# Patient Record
Sex: Male | Born: 1940 | Race: White | Hispanic: No | Marital: Married
Health system: Midwestern US, Academic
[De-identification: ages and names within clinical notes are randomized; demographics above are authoritative.]

---

## 2016-05-06 MED ORDER — METOPROLOL SUCCINATE 100 MG PO TB24
100 mg | ORAL_TABLET | Freq: Two times a day (BID) | ORAL | 3 refills | 90.00000 days | Status: DC
Start: 2016-05-06 — End: 2016-05-06

## 2016-05-06 MED ORDER — PERFLUTREN LIPID MICROSPHERES 1.1 MG/ML IV SUSP
1-20 mL | Freq: Once | INTRAVENOUS | 0 refills | Status: CP
Start: 2016-05-06 — End: ?

## 2016-05-06 MED ORDER — METOPROLOL SUCCINATE 100 MG PO TB24
100 mg | ORAL_TABLET | Freq: Two times a day (BID) | ORAL | 3 refills | 90.00000 days | Status: DC
Start: 2016-05-06 — End: 2016-05-14

## 2016-05-29 MED ORDER — PROPOFOL INJ 10 MG/ML IV VIAL
0 refills | Status: DC
Start: 2016-05-29 — End: 2016-05-29

## 2016-05-29 MED ORDER — ROCURONIUM 10 MG/ML IV SOLN
INTRAVENOUS | 0 refills | Status: DC
Start: 2016-05-29 — End: 2016-05-29

## 2016-05-29 MED ORDER — EPHEDRINE SULFATE 50 MG/5ML SYR (10 MG/ML) (AN)(OSM)
0 refills | Status: DC
Start: 2016-05-29 — End: 2016-05-29

## 2016-05-29 MED ORDER — FUROSEMIDE 10 MG/ML IJ SOLN
0 refills | Status: DC
Start: 2016-05-29 — End: 2016-05-29

## 2016-05-29 MED ORDER — SUGAMMADEX 100 MG/ML IV SOLN
INTRAVENOUS | 0 refills | Status: DC
Start: 2016-05-29 — End: 2016-05-29

## 2016-05-29 MED ORDER — FENTANYL CITRATE (PF) 50 MCG/ML IJ SOLN
0 refills | Status: DC
Start: 2016-05-29 — End: 2016-05-29

## 2016-05-29 MED ORDER — LIDOCAINE (PF) 200 MG/10 ML (2 %) IJ SYRG
0 refills | Status: DC
Start: 2016-05-29 — End: 2016-05-29

## 2016-05-29 MED ORDER — DEXAMETHASONE SODIUM PHOSPHATE 4 MG/ML IJ SOLN
INTRAVENOUS | 0 refills | Status: DC
Start: 2016-05-29 — End: 2016-05-29

## 2016-05-29 MED ORDER — PHENYLEPHRINE IN 0.9% NACL(PF) 1 MG/10 ML (100 MCG/ML) IV SYRG
INTRAVENOUS | 0 refills | Status: DC
Start: 2016-05-29 — End: 2016-05-29

## 2016-05-29 MED ORDER — ONDANSETRON HCL (PF) 4 MG/2 ML IJ SOLN
INTRAVENOUS | 0 refills | Status: DC
Start: 2016-05-29 — End: 2016-05-29

## 2016-06-27 MED ORDER — MIRABEGRON 50 MG PO TB24
50 mg | ORAL_TABLET | Freq: Every day | ORAL | 11 refills | Status: DC
Start: 2016-06-27 — End: 2016-08-22

## 2016-06-27 MED ORDER — TROSPIUM 60 MG PO CP24
60 mg | ORAL_CAPSULE | Freq: Every day | ORAL | 11 refills | 30.00000 days | Status: DC
Start: 2016-06-27 — End: 2017-02-21

## 2016-08-21 ENCOUNTER — Encounter: Admit: 2016-08-21 | Discharge: 2016-08-21 | Payer: MEDICARE

## 2016-08-21 MED ORDER — MYRBETRIQ 50 MG PO TB24
50 mg | ORAL_TABLET | Freq: Every day | ORAL | 3 refills | Status: AC
Start: 2016-08-21 — End: ?

## 2016-08-22 ENCOUNTER — Ambulatory Visit: Admit: 2016-08-22 | Discharge: 2016-08-22 | Payer: MEDICARE

## 2016-08-22 ENCOUNTER — Encounter: Admit: 2016-08-22 | Discharge: 2016-08-22 | Payer: MEDICARE

## 2016-08-22 DIAGNOSIS — I480 Paroxysmal atrial fibrillation: ICD-10-CM

## 2016-08-22 DIAGNOSIS — N390 Urinary tract infection, site not specified: ICD-10-CM

## 2016-08-22 DIAGNOSIS — K219 Gastro-esophageal reflux disease without esophagitis: ICD-10-CM

## 2016-08-22 DIAGNOSIS — N401 Enlarged prostate with lower urinary tract symptoms: Principal | ICD-10-CM

## 2016-08-22 DIAGNOSIS — K635 Polyp of colon: ICD-10-CM

## 2016-08-22 DIAGNOSIS — I10 Essential (primary) hypertension: ICD-10-CM

## 2016-08-22 DIAGNOSIS — Z9889 Other specified postprocedural states: ICD-10-CM

## 2016-08-22 DIAGNOSIS — N529 Male erectile dysfunction, unspecified: ICD-10-CM

## 2016-08-22 DIAGNOSIS — N400 Enlarged prostate without lower urinary tract symptoms: ICD-10-CM

## 2016-08-22 DIAGNOSIS — I219 Acute myocardial infarction, unspecified: ICD-10-CM

## 2016-08-22 DIAGNOSIS — R32 Unspecified urinary incontinence: ICD-10-CM

## 2016-08-22 DIAGNOSIS — T50905A Adverse effect of unspecified drugs, medicaments and biological substances, initial encounter: ICD-10-CM

## 2016-08-22 DIAGNOSIS — G4730 Sleep apnea, unspecified: ICD-10-CM

## 2016-08-22 DIAGNOSIS — E785 Hyperlipidemia, unspecified: Principal | ICD-10-CM

## 2016-08-22 DIAGNOSIS — E039 Hypothyroidism, unspecified: ICD-10-CM

## 2016-08-22 DIAGNOSIS — I428 Other cardiomyopathies: ICD-10-CM

## 2016-08-22 DIAGNOSIS — Z95810 Presence of automatic (implantable) cardiac defibrillator: ICD-10-CM

## 2016-08-22 DIAGNOSIS — Z8679 Personal history of other diseases of the circulatory system: ICD-10-CM

## 2016-08-22 DIAGNOSIS — K469 Unspecified abdominal hernia without obstruction or gangrene: ICD-10-CM

## 2016-08-22 DIAGNOSIS — E162 Hypoglycemia, unspecified: ICD-10-CM

## 2016-08-22 DIAGNOSIS — M1990 Unspecified osteoarthritis, unspecified site: ICD-10-CM

## 2016-08-22 DIAGNOSIS — Z789 Other specified health status: ICD-10-CM

## 2016-08-22 DIAGNOSIS — R040 Epistaxis: ICD-10-CM

## 2016-08-22 DIAGNOSIS — I340 Nonrheumatic mitral (valve) insufficiency: ICD-10-CM

## 2016-08-22 DIAGNOSIS — Z9229 Personal history of other drug therapy: ICD-10-CM

## 2016-08-22 NOTE — Progress Notes
Date of Service: 08/22/2016     Chief Complaint   Patient presents with   ??? Benign Prostatic Hypertrophy       Subjective:             Joseph Hoover is a 76 y.o. male.    History of Present Illness    76 yo male with 23 g prostate s/p HoLAP 05/29/2016.  Path negative, 2 g.  Having recurrent LUTS with very slow stream, Urgency.      Preop AUA ss 24, bother 4  Postop AUA ss 23, bother 4    Preop SHIM 0  Postop SHIM 1    Preop PVR 94   Postop PVR 200     Review of Systems      Constitutional: Negative for fever, chills, fatigue and unexpected weight change.   HENT: Negative for hearing loss, sore throat and voice change.    Eyes: Negative for visual disturbance.   Respiratory: Negative for cough, shortness of breath and wheezing.    Cardiovascular: Negative for chest pain, palpitations and leg swelling.   Gastrointestinal: Negative for nausea, vomiting, abdominal pain, diarrhea and constipation.   Endocrine: Negative for polydipsia and polyuria.   Musculoskeletal: Negative for back pain, joint swelling and arthralgias.   Skin: Negative for rash.   Neurological: Negative for dizziness, seizures, weakness, light-headedness and headaches.   Hematological: Negative for adenopathy. Does not bruise/bleed easily.   Psychiatric/Behavioral: Negative for suicidal ideas and confusion.       Objective:         ??? acetaminophen (TYLENOL) 500 mg tablet Take 1,000 mg by mouth at bedtime as needed.   ??? acetaminophen/codeine (TYLENOL #3) 300/30 mg tablet Take 1-2 Tabs by mouth every 4 hours as needed for Pain.   ??? albuterol (PROVENTIL HFA, VENTOLIN HFA) 90 mcg/Actuation IN inhaler Inhale 1 puff by mouth into the lungs every 6 hours as needed for Wheezing.   ??? artificial tears(hypromellose) 0.4 % ophthalmic solution Place 1 drop into or around eye(s) five times daily as needed.   ??? aspirin EC 81 mg tablet Take 81 mg by mouth daily.   ??? cetirizine (ZYRTEC) 10 mg tablet Take 10 mg by mouth at bedtime daily. ??? cholecalciferol (VITAMIN D-3) 1,000 units tablet Take 1,000 Units by mouth daily.   ??? coQ10 (ubiquinol) 100 mg cap Take 1 capsule by mouth twice daily.   ??? cyanocobalamin (VITAMIN B-12, RUBRAMIN) 1,000 mcg/mL injection Inject 1 mL into the muscle every 30 days.   ??? digoxin (LANOXIN) 250 mcg tablet Take 1 Tab by mouth daily.   ??? finasteride (PROSCAR) 5 mg tablet Take 5 mg by mouth at bedtime daily.   ??? fluticasone (FLONASE) 50 mcg/Actuation nasal spray Apply 1 spray to each nostril as directed at bedtime daily.   ??? furosemide (LASIX) 20 mg tablet Take 60 mg by mouth daily.   ??? guaiFENesin LA (MUCINEX) 600 mg tablet Take 600 mg by mouth at bedtime daily.   ??? HYDROcodone/acetaminophen (NORCO) 5/325 mg tablet Take 1 tablet by mouth every 6 hours as needed for Pain   ??? levothyroxine (SYNTHROID) 75 mcg tablet Take 1 Tab by mouth daily.   ??? Lidocaine 4 % ptmd Apply 1 patch topically to affected area daily as needed.   ??? losartan(+) (COZAAR) 100 mg tablet Take 50 mg by mouth daily.   ??? metoprolol XL (TOPROL XL) 100 mg extended release tablet Take 100 mg by mouth twice daily.   ??? MYRBETRIQ 50 mg  tablet Take 1 tablet by mouth daily.   ??? nitroglycerin (NITROSTAT) 0.4 mg tablet 1 Tab As Needed for Chest Pain.   ??? potassium chloride SR (K-DUR) 20 mEq tablet Take 20 mEq by mouth daily. Take with a meal and a full glass of water.   ??? ranitidine(+) (ZANTAC) 150 mg tablet Take 150 mg by mouth at bedtime daily.   ??? Saliva Stimulant Agents Comb.3 (BIOTENE MOISTURIZING MOUTH) spry by Mucous Membrane route daily as needed.   ??? senna/docusate (SENOKOT-S) 8.6/50 mg tablet Take 1 tablet by mouth twice daily as needed.   ??? traMADol (ULTRAM) 50 mg tablet Take 50 mg by mouth every 6 hours as needed for Pain.   ??? trospium ER(+) (SANCTURA XR) 60 mg capsule Take 1 capsule by mouth daily. Do not cut/ crush/ chew   ??? vit C,E-Zn-coppr-lutein-zeaxan (PRESERVISION AREDS 2) 250-200-40-1 mg-unit-mg-mg cap Take 1 capsule by mouth twice daily. ??? warfarin (COUMADIN) 2 mg tablet Take 2 Tabs by mouth daily. 4mg  everyday except Sunday is 2mg  (Patient taking differently: Take  by mouth at bedtime daily. TuThSaSu= 3mg   MWF= 4mg )     Vitals:    08/22/16 1100   BP: 116/67   Pulse: 83   Weight: 95.7 kg (211 lb)   Height: 182.9 cm (72)     Body mass index is 28.62 kg/m???.     Physical Exam    Gen - NAD  HENT - normocephalic  Neck - normal range of motion  Heart - normal rate  Lungs - normal effort    Ext - no edema, erythema or tenderness  Neuro - alert and oriented x 3  Skin - warm and dry  Psych - normal behavior       Assessment and Plan:    Visit Dx:  1. Benign prostatic hyperplasia with urinary frequency      Worried about urethral stricture vs bladder neck contracture.  Will plan for cystoscopy, then UDS if no stricture.                       Vonna Drafts, MD, MPH

## 2016-08-22 NOTE — Progress Notes
PVR 200 ml

## 2016-09-04 ENCOUNTER — Ambulatory Visit: Admit: 2016-09-04 | Discharge: 2016-09-04 | Payer: MEDICARE

## 2016-09-04 DIAGNOSIS — I428 Other cardiomyopathies: Principal | ICD-10-CM

## 2016-09-04 DIAGNOSIS — I472 Ventricular tachycardia: ICD-10-CM

## 2016-09-04 DIAGNOSIS — Z95810 Presence of automatic (implantable) cardiac defibrillator: ICD-10-CM

## 2016-10-01 ENCOUNTER — Encounter: Admit: 2016-10-01 | Discharge: 2016-10-01 | Payer: MEDICARE

## 2016-10-01 ENCOUNTER — Ambulatory Visit: Admit: 2016-10-01 | Discharge: 2016-10-01 | Payer: MEDICARE

## 2016-10-01 DIAGNOSIS — N529 Male erectile dysfunction, unspecified: ICD-10-CM

## 2016-10-01 DIAGNOSIS — N401 Enlarged prostate with lower urinary tract symptoms: Principal | ICD-10-CM

## 2016-10-01 DIAGNOSIS — I10 Essential (primary) hypertension: ICD-10-CM

## 2016-10-01 DIAGNOSIS — T50905A Adverse effect of unspecified drugs, medicaments and biological substances, initial encounter: ICD-10-CM

## 2016-10-01 DIAGNOSIS — Z9889 Other specified postprocedural states: ICD-10-CM

## 2016-10-01 DIAGNOSIS — N390 Urinary tract infection, site not specified: ICD-10-CM

## 2016-10-01 DIAGNOSIS — Z8679 Personal history of other diseases of the circulatory system: ICD-10-CM

## 2016-10-01 DIAGNOSIS — N320 Bladder-neck obstruction: Principal | ICD-10-CM

## 2016-10-01 DIAGNOSIS — I340 Nonrheumatic mitral (valve) insufficiency: ICD-10-CM

## 2016-10-01 DIAGNOSIS — Z789 Other specified health status: ICD-10-CM

## 2016-10-01 DIAGNOSIS — I219 Acute myocardial infarction, unspecified: ICD-10-CM

## 2016-10-01 DIAGNOSIS — E785 Hyperlipidemia, unspecified: Principal | ICD-10-CM

## 2016-10-01 DIAGNOSIS — K469 Unspecified abdominal hernia without obstruction or gangrene: ICD-10-CM

## 2016-10-01 DIAGNOSIS — E039 Hypothyroidism, unspecified: ICD-10-CM

## 2016-10-01 DIAGNOSIS — I480 Paroxysmal atrial fibrillation: ICD-10-CM

## 2016-10-01 DIAGNOSIS — M1990 Unspecified osteoarthritis, unspecified site: ICD-10-CM

## 2016-10-01 DIAGNOSIS — I428 Other cardiomyopathies: ICD-10-CM

## 2016-10-01 DIAGNOSIS — Z9229 Personal history of other drug therapy: ICD-10-CM

## 2016-10-01 DIAGNOSIS — K219 Gastro-esophageal reflux disease without esophagitis: ICD-10-CM

## 2016-10-01 DIAGNOSIS — R040 Epistaxis: ICD-10-CM

## 2016-10-01 DIAGNOSIS — G4730 Sleep apnea, unspecified: ICD-10-CM

## 2016-10-01 DIAGNOSIS — K635 Polyp of colon: ICD-10-CM

## 2016-10-01 DIAGNOSIS — R32 Unspecified urinary incontinence: ICD-10-CM

## 2016-10-01 DIAGNOSIS — Z95810 Presence of automatic (implantable) cardiac defibrillator: ICD-10-CM

## 2016-10-01 DIAGNOSIS — N400 Enlarged prostate without lower urinary tract symptoms: ICD-10-CM

## 2016-10-01 DIAGNOSIS — E162 Hypoglycemia, unspecified: ICD-10-CM

## 2016-10-01 MED ORDER — CIPROFLOXACIN HCL 500 MG PO TAB
500 mg | ORAL_TABLET | Freq: Two times a day (BID) | ORAL | 0 refills | 10.00000 days | Status: AC
Start: 2016-10-01 — End: 2016-10-02

## 2016-10-01 MED ORDER — CEFAZOLIN INJ 1GM IVP
2 g | Freq: Once | INTRAVENOUS | 0 refills | Status: CN
Start: 2016-10-01 — End: ?

## 2016-10-01 NOTE — Procedures
Procedure:  Cystourethroscopy    Provider: Vonna Drafts, MD    Anesthesia:  2% Lidocaine Gel, Intraurethral  Complications:  None    Indication for Procedure:  LUTS after HoLEP    After informed consent obtained, he was placed in dorsolithotomy position then prepped in the usual fashion.  Local 2% lidocaine gel placed per urethra.  The flexible cystoscope was gently passed into the urethra under direct visualization.  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  The cystoscope could not be He tolerated the procedure well, and left the exam room in a pleasant disposition.    Assessment/ Plan:    To OR for laser incision of BNC.      Orders Placed This Encounter    CYSTOSCOPY    ciprofloxacin (CIPRO) 500 mg tablet       Vonna Drafts, MD

## 2016-10-02 ENCOUNTER — Encounter: Admit: 2016-10-02 | Discharge: 2016-10-02 | Payer: MEDICARE

## 2016-10-02 DIAGNOSIS — N320 Bladder-neck obstruction: Principal | ICD-10-CM

## 2016-10-02 NOTE — Pre-Anesthesia Medication Instructions
YOUR MEDICATIONS:    ??? acetaminophen (TYLENOL) 500 mg tablet Take 1,000 mg by mouth at bedtime as needed.   ??? acetaminophen/codeine (TYLENOL #3) 300/30 mg tablet Take 1-2 Tabs by mouth every 4 hours as needed for Pain.   ??? albuterol (PROVENTIL HFA, VENTOLIN HFA) 90 mcg/Actuation IN inhaler Inhale 1 puff by mouth into the lungs every 6 hours as needed for Wheezing.   ??? artificial tears(hypromellose) 0.4 % ophthalmic solution Place 1 drop into or around eye(s) five times daily as needed.   ??? aspirin EC 81 mg tablet Take 81 mg by mouth daily.   ??? cetirizine (ZYRTEC) 10 mg tablet Take 10 mg by mouth at bedtime daily.   ??? cholecalciferol (VITAMIN D-3) 1,000 units tablet Take 1,000 Units by mouth daily.   ??? coQ10 (ubiquinol) 100 mg cap Take 1 capsule by mouth twice daily.   ??? cyanocobalamin (VITAMIN B-12, RUBRAMIN) 1,000 mcg/mL injection Inject 1 mL into the muscle every 30 days.   ??? digoxin (LANOXIN) 250 mcg tablet Take 1 Tab by mouth daily.   ??? finasteride (PROSCAR) 5 mg tablet Take 5 mg by mouth at bedtime daily.   ??? fluticasone (FLONASE) 50 mcg/Actuation nasal spray Apply 1 spray to each nostril as directed at bedtime daily.   ??? furosemide (LASIX) 20 mg tablet Take 60 mg by mouth daily.   ??? guaiFENesin LA (MUCINEX) 600 mg tablet Take 600 mg by mouth at bedtime daily.   ??? HYDROcodone/acetaminophen (NORCO) 5/325 mg tablet Take 1 tablet by mouth every 6 hours as needed for Pain   ??? levothyroxine (SYNTHROID) 75 mcg tablet Take 1 Tab by mouth daily.   ??? Lidocaine 4 % ptmd Apply 1 patch topically to affected area daily as needed.   ??? losartan(+) (COZAAR) 100 mg tablet Take 50 mg by mouth daily.   ??? metoprolol XL (TOPROL XL) 100 mg extended release tablet Take 100 mg by mouth twice daily.   ??? MYRBETRIQ 50 mg tablet Take 1 tablet by mouth daily.   ??? nitroglycerin (NITROSTAT) 0.4 mg tablet 1 Tab As Needed for Chest Pain.   ??? potassium chloride SR (K-DUR) 20 mEq tablet Take 20 mEq by mouth daily. Take with a meal and a full glass of water.   ??? ranitidine(+) (ZANTAC) 150 mg tablet Take 150 mg by mouth at bedtime daily.   ??? Saliva Stimulant Agents Comb.3 (BIOTENE MOISTURIZING MOUTH) spry by Mucous Membrane route daily as needed.   ??? senna/docusate (SENOKOT-S) 8.6/50 mg tablet Take 1 tablet by mouth twice daily as needed.   ??? traMADol (ULTRAM) 50 mg tablet Take 50 mg by mouth every 6 hours as needed for Pain.   ??? trospium ER(+) (SANCTURA XR) 60 mg capsule Take 1 capsule by mouth daily. Do not cut/ crush/ chew   ??? vit C,E-Zn-coppr-lutein-zeaxan (PRESERVISION AREDS 2) 250-200-40-1 mg-unit-mg-mg cap Take 1 capsule by mouth twice daily.   ??? warfarin (COUMADIN) 2 mg tablet Take 2 Tabs by mouth daily. 4mg  everyday except Sunday is 2mg  (Patient taking differently: Take  by mouth at bedtime daily. TuThSaSu= 3mg   MWF= 4mg )          YOUR  MEDICATION INSTRUCTIONS FOR SURGERY:    Before surgery  Stop the following vitamins, herbals, and natural supplements now:  ??? Co Q-10  ??? Areds Preservision    Stop the following medications now:  ??? Anti-inflammatory medications such as ibuprofen (Advil, Motrin) and naproxen (Aleve)   You may use acetaminophen (Tylenol)  Please follow these instructions regarding your blood thinner medications:  ??? Warfarin and Aspirin - please follow instructions given by Dr. Zachery Dauer      Morning of surgery  On the morning of surgery, do NOT take these medications:  ??? Remaining vitamins/supplements  ??? Ointments/creams/lotions  ??? Vitamin D  ??? Vitamin B-12  ??? Lasix  ??? Losartan  ??? Potassium  ??? Senokot  ??? Mucinex    On the morning of surgery, take ONLY these medications with a sip (1-2 ounces) of water:  ??? Inhalers if needed  ??? Zyrtec  ??? Digoxin  ??? Proscar  ??? Flomax  ??? Levothyroxine  ??? Metoprolol  ??? Zantac  ??? Sanctura      Other information  Before surgery, please contact Bjorn Loser, RN with any medicine updates or questions.  ??? E-mail:  [email or web address]  Phone:  [phone] Before going home from the hospital, please ask your doctor when you should re-start your medicines that were stopped before surgery.

## 2016-10-02 NOTE — Pre-Anesthesia Patient Instructions
GENERAL INFORMATION    Before you come to the hospital  ??? Make arrangements for a responsible adult to drive you home and stay with you for 24 hours following surgery.  ??? Bath/Shower Instructions  ??? Take a bath or shower with antibacterial soap the night before or the morning of your procedure. Use clean towels.  ??? Put on clean clothes after bath or shower.  Avoid using lotion and oils.  ??? Sleep on clean sheets if bath or shower is done the night before procedure.  ??? Leave money, credit cards, jewelry, and any other valuables at home. [facility] is not responsible for the loss or breakage of personal items.  ??? Remove nail polish, makeup and all jewelry (including piercings) before coming to the hospital.  ??? The morning of your procedure:  ??? brush your teeth and tongue  ??? do not smoke  ??? do not shave the area where you will have surgery    What to bring to the hospital  ??? ID/ Insurance Card  ??? Medical Device card  ??? Official documents for legal guardianship   ??? Copy of your Living Will, Advanced Directives, and/or Durable Power of Attorney   ??? Small bag with a few personal belongings  ??? Cases for glasses/hearing aids/contact lens (bring solutions for contacts)  ??? Dress in clean, loose, comfortable clothing     Eating or drinking before surgery  ??? Do not eat or drink anything after 11:00 p.m. the day before your procedure (including gum, mints, candy, or chewing tobacco) OR follow the specific instructions you were given by your Surgeon.  ??? You may have WATER ONLY up to 2 hours before arriving at the hospital.    Other instructions  Notify your surgeon if:  ??? you become ill with a cough, fever, sore throat, nausea, vomiting or flu-like symptoms  ??? you have any open wounds/sores that are red, painful, draining, or are new since you last saw  the doctor  ??? you need to cancel your procedure  ??? You will receive a call with your surgery arrival time from between 2:30pm and 4:30pm the last business day before your procedure.  If you do not receive a call, please call [phone] before 4:30pm or [phone] after 4:30pm.    Notify us at [facility]: [phone]  ??? if you need to cancel your procedure  ??? if you are going to be late    Arrival at the hospital    [facility]  [address]  [place], [place] [address]    ??? Park in the [employer], located directly across from the main entrance to the hospital.  ??? Valet parking is available  from 7 AM to 4 PM Monday through Friday.  ??? Enter through the ground floor main hospital entrance and check in at the Information Desk in the lobby.  ??? They will validate your parking ticket and direct you to the next location.  ??? If you are a woman between the ages of 84 and 38, and have not had a hysterectomy, you will be asked for a urine sample prior to surgery.  Please do not urinate before arriving in the Surgery Waiting Room.  Once there, check in and let the attendant know if you need to provide a sample.

## 2016-10-02 NOTE — Telephone Encounter
Returned call to patient.  Clarified ok to finish 2 tabs of Cipro that was given after Cysto.  Pt expressed understanding and has no further questions at this time.

## 2016-10-17 LAB — COMPREHENSIVE METABOLIC PANEL
Lab: 1
Lab: 1.8 — ABNORMAL HIGH (ref 0–1)
Lab: 10 — ABNORMAL HIGH (ref 8.8–10)
Lab: 106
Lab: 17
Lab: 24
Lab: 27
Lab: 4.3
Lab: 57
Lab: 8.4 — ABNORMAL HIGH (ref 6.2–8.1)

## 2016-10-17 LAB — FREE T4 (FREE THYROXINE) ONLY: Lab: 1.3

## 2016-10-17 LAB — THYROID STIMULATING HORMONE-TSH: Lab: 1.5

## 2016-10-23 ENCOUNTER — Encounter: Admit: 2016-10-23 | Discharge: 2016-10-23 | Payer: MEDICARE

## 2016-10-23 ENCOUNTER — Ambulatory Visit: Admit: 2016-10-23 | Discharge: 2016-10-23 | Payer: MEDICARE

## 2016-10-23 ENCOUNTER — Ambulatory Visit: Admit: 2016-10-22 | Discharge: 2016-10-22 | Payer: MEDICARE

## 2016-10-23 DIAGNOSIS — I10 Essential (primary) hypertension: ICD-10-CM

## 2016-10-23 DIAGNOSIS — I252 Old myocardial infarction: ICD-10-CM

## 2016-10-23 DIAGNOSIS — N401 Enlarged prostate with lower urinary tract symptoms: ICD-10-CM

## 2016-10-23 DIAGNOSIS — E039 Hypothyroidism, unspecified: ICD-10-CM

## 2016-10-23 DIAGNOSIS — Z7982 Long term (current) use of aspirin: ICD-10-CM

## 2016-10-23 DIAGNOSIS — N320 Bladder-neck obstruction: Principal | ICD-10-CM

## 2016-10-23 DIAGNOSIS — Z952 Presence of prosthetic heart valve: ICD-10-CM

## 2016-10-23 DIAGNOSIS — K219 Gastro-esophageal reflux disease without esophagitis: ICD-10-CM

## 2016-10-23 DIAGNOSIS — G4730 Sleep apnea, unspecified: ICD-10-CM

## 2016-10-23 DIAGNOSIS — Z95810 Presence of automatic (implantable) cardiac defibrillator: ICD-10-CM

## 2016-10-23 DIAGNOSIS — Z7901 Long term (current) use of anticoagulants: ICD-10-CM

## 2016-10-23 DIAGNOSIS — Z87891 Personal history of nicotine dependence: ICD-10-CM

## 2016-10-23 DIAGNOSIS — E785 Hyperlipidemia, unspecified: ICD-10-CM

## 2016-10-23 LAB — BASIC METABOLIC PANEL
Lab: 0.9 mg/dL (ref 0.4–1.24)
Lab: 103 MMOL/L (ref 98–110)
Lab: 136 MMOL/L — ABNORMAL LOW (ref 137–147)
Lab: 22 mg/dL — ABNORMAL HIGH (ref 7–25)
Lab: 27 MMOL/L — ABNORMAL LOW (ref 21–30)
Lab: 6 pg — ABNORMAL LOW (ref 3–12)
Lab: 97 mg/dL — ABNORMAL LOW (ref 70–100)

## 2016-10-23 LAB — CBC: Lab: 7.2 10*3/uL (ref 4.5–11.0)

## 2016-10-23 LAB — PROTIME INR (PT): Lab: 2.3 MMOL/L — ABNORMAL HIGH (ref 0.8–1.2)

## 2016-10-23 MED ORDER — OXYCODONE 5 MG PO TAB
5 mg | Freq: Once | ORAL | 0 refills | Status: CP
Start: 2016-10-23 — End: ?
  Administered 2016-10-23: 16:00:00 5 mg via ORAL

## 2016-10-23 MED ORDER — FENTANYL CITRATE (PF) 50 MCG/ML IJ SOLN
0 refills | Status: DC
Start: 2016-10-23 — End: 2016-10-23
  Administered 2016-10-23: 15:00:00 50 ug via INTRAVENOUS
  Administered 2016-10-23: 15:00:00 100 ug via INTRAVENOUS

## 2016-10-23 MED ORDER — HYDROMORPHONE (PF) 2 MG/ML IJ SYRG
.2-.5 mg | INTRAVENOUS | 0 refills | Status: DC | PRN
Start: 2016-10-23 — End: 2016-10-23
  Administered 2016-10-23 (×2): 0.5 mg via INTRAVENOUS

## 2016-10-23 MED ORDER — CEFAZOLIN INJ 1GM IVP
2 g | Freq: Once | INTRAVENOUS | 0 refills | Status: CP
Start: 2016-10-23 — End: ?
  Administered 2016-10-23: 15:00:00 2 g via INTRAVENOUS

## 2016-10-23 MED ORDER — ONDANSETRON HCL (PF) 4 MG/2 ML IJ SOLN
4 mg | INTRAVENOUS | 0 refills | Status: DC | PRN
Start: 2016-10-23 — End: 2016-10-23
  Administered 2016-10-23: 17:00:00 4 mg via INTRAVENOUS

## 2016-10-23 MED ORDER — ETOMIDATE 2 MG/ML IV SOLN
0 refills | Status: DC
Start: 2016-10-23 — End: 2016-10-23
  Administered 2016-10-23: 15:00:00 4 mg via INTRAVENOUS
  Administered 2016-10-23: 15:00:00 10 mg via INTRAVENOUS

## 2016-10-23 MED ORDER — FENTANYL CITRATE (PF) 50 MCG/ML IJ SOLN
25-50 ug | INTRAVENOUS | 0 refills | Status: DC | PRN
Start: 2016-10-23 — End: 2016-10-23

## 2016-10-23 MED ORDER — DEXTRAN 70-HYPROMELLOSE (PF) 0.1-0.3 % OP DPET
0 refills | Status: DC
Start: 2016-10-23 — End: 2016-10-23
  Administered 2016-10-23: 15:00:00 1 [drp] via OPHTHALMIC

## 2016-10-23 MED ORDER — EPHEDRINE SULFATE 50 MG/5ML SYR (10 MG/ML) (AN)(OSM)
0 refills | Status: DC
Start: 2016-10-23 — End: 2016-10-23
  Administered 2016-10-23 (×2): 5 mg via INTRAVENOUS

## 2016-10-23 MED ORDER — HYOSCYAMINE SULFATE 0.125 MG PO TBDI
.125 mg | SUBLINGUAL | 0 refills | Status: DC | PRN
Start: 2016-10-23 — End: 2016-10-23
  Administered 2016-10-23: 18:00:00 0.125 mg via SUBLINGUAL

## 2016-10-23 MED ORDER — DEXAMETHASONE SODIUM PHOSPHATE 4 MG/ML IJ SOLN
INTRAVENOUS | 0 refills | Status: DC
Start: 2016-10-23 — End: 2016-10-23
  Administered 2016-10-23: 15:00:00 4 mg via INTRAVENOUS

## 2016-10-23 MED ORDER — LACTATED RINGERS IV SOLP
1000 mL | INTRAVENOUS | 0 refills | Status: DC
Start: 2016-10-23 — End: 2016-10-23
  Administered 2016-10-23: 14:00:00 1000 mL via INTRAVENOUS

## 2016-10-23 MED ORDER — BELLADONNA ALKALOIDS-OPIUM 16.2-60 MG RE SUPP
0 refills | Status: DC
Start: 2016-10-23 — End: 2016-10-23
  Administered 2016-10-23: 15:00:00 1 via RECTAL

## 2016-10-23 MED ORDER — LIDOCAINE HCL 2 % MM JELP
0 refills | Status: DC
Start: 2016-10-23 — End: 2016-10-23
  Administered 2016-10-23: 15:00:00 20 mL via TOPICAL

## 2016-10-23 MED ORDER — SODIUM CHLORIDE 0.9% IRRIGATION BAG
0 refills | Status: DC
Start: 2016-10-23 — End: 2016-10-23
  Administered 2016-10-23: 15:00:00 3000 mL

## 2016-10-23 MED ORDER — OXYCODONE 5 MG PO TAB
5 mg | ORAL_TABLET | ORAL | 0 refills | 6.00000 days | Status: AC | PRN
Start: 2016-10-23 — End: 2017-02-21

## 2016-10-23 MED ORDER — OXYBUTYNIN CHLORIDE 5 MG PO TAB
5 mg | ORAL_TABLET | Freq: Three times a day (TID) | ORAL | 0 refills | 12.00000 days | Status: AC | PRN
Start: 2016-10-23 — End: 2017-02-21

## 2016-10-23 MED ORDER — HYOSCYAMINE SULFATE 0.125 MG SL SUBL
125 ug | ORAL_TABLET | SUBLINGUAL | 0 refills | Status: AC | PRN
Start: 2016-10-23 — End: 2017-02-21

## 2016-10-23 MED ORDER — ACETAMINOPHEN 650 MG PO TBER
650 mg | ORAL_TABLET | ORAL | 0 refills | Status: SS | PRN
Start: 2016-10-23 — End: 2017-04-02

## 2016-10-23 NOTE — Progress Notes
Pt arrived to [facility] at . Discharge instructions were given, questions answered, re-eduction of how to removed foley , and IV was removed. Pt has their belongings,dressed and transport took pt to Lobby via wheelchair.

## 2016-10-23 NOTE — H&P (View-Only)
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    PLAN:  - to OR for laser incision of bladder neck contracture  - consent obtained and in chart  - peri-operative antibiotics ordered    Discussed plan of care with staff surgeon, Dr. Zachery Dauer, who directed plan of care.    Margot Chimes, MD  PGY-3 Urology Resident  Please page Urology on call with any questions   __________________________________________________________________________________    HPI: [name] is a 76 y.o. male s/p HoLAP in 05/29/2016 who began to have recurrent LUTS with very slow stream, Urgency. He was found to have tight bladder neck contracture.    Past Medical History:   Diagnosis Date   ??? Adverse drug reaction    ??? Arthritis    ??? BPH with obstruction/lower urinary tract symptoms    ??? Chest pain    ??? Colon polyps    ??? Dyslipidemia    ??? Enlarged prostate    ??? Epistaxis 08/31/2008   ??? Erectile dysfunction    ??? GERD (gastroesophageal reflux disease)    ??? Hernia of unspecified site of abdominal cavity without mention of obstruction or gangrene    ??? History of ventricular tachycardia/nonsustained 08/31/2008   ??? HX: anticoagulation    ??? Hypertension    ??? Hypoglycemia 08/31/2008   ??? Hypothyroid    ??? 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  11/2003 - he was in [place] and developed atrial fibrillation. They thought his EF was                    low and placed a CRT-D with plans to ablate his AV node. They used a Guidant                    Contak HE 179 with Medtronic 4460 atrial lead and Guidant 0185 RV lead and                     Medtronic 4402 transvenous lead which didn't work.  They then placed an epicardial          lead (dia   ??? Incontinence    ??? MI (myocardial infarction) (HCC) ~1982    r/t mitral valve per pt   ??? Mitral regurgitation 08/31/2008   ??? Non-ischemic cardiomyopathy (HCC)    ??? Paroxysmal atrial fibrillation (HCC)    ??? S/P cardiac cath-LAD 25-50 % LCX 25% 08/31/2008   ??? Sleep apnea 08/31/2008    Bipap   ??? Unspecified conditions influencing health status    ??? Urinary tract infection      Past Surgical History:   Procedure Laterality Date   ??? MITRAL VALVE REPLACEMENT  1998   ??? RHYTHM DEVICE PLACEMENT  2005   ??? CARDIAC DEFIBRILLATOR PLACEMENT  2005    upgrade to ICD. Multiple battery changes, last in 2015   ??? PROSTATECTOMY N/A 05/29/2016    HOLMIUM LASER ABLATION PROSTATE (23 GMS) performed by Vonna Drafts, MD at [facility]   ??? ACL RECONSTRUCTION Right ~2000's   ??? HERNIA REPAIR  Multiple   ??? HX CHOLECYSTECTOMY  ~2000's   ???  HX VASECTOMY  1970's     Medications:  No current facility-administered medications on file prior to encounter.      Current Outpatient Prescriptions on File Prior to Encounter   Medication Sig Dispense Refill   ??? acetaminophen (TYLENOL) 500 mg tablet Take 1,000 mg by mouth at bedtime as needed.     ??? acetaminophen/codeine (TYLENOL #3) 300/30 mg tablet Take 1-2 Tabs by mouth every 4 hours as needed for Pain.     ??? albuterol (PROVENTIL HFA, VENTOLIN HFA) 90 mcg/Actuation IN inhaler Inhale 1 puff by mouth into the lungs every 6 hours as needed for Wheezing.     ??? [DISCONTINUED] amiodarone (CORDARONE) 200 mg PO Tab take 1 Tab by mouth Every 24 Hours.  0 0   ??? artificial tears(hypromellose) 0.4 % ophthalmic solution Place 1 drop into or around eye(s) five times daily as needed.     ??? aspirin EC 81 mg tablet Take 81 mg by mouth daily.     ??? cetirizine (ZYRTEC) 10 mg tablet Take 10 mg by mouth at bedtime daily.     ??? cholecalciferol (VITAMIN D-3) 1,000 units tablet Take 1,000 Units by mouth daily.     ??? coQ10 (ubiquinol) 100 mg cap Take 1 capsule by mouth twice daily. ??? cyanocobalamin (VITAMIN B-12, RUBRAMIN) 1,000 mcg/mL injection Inject 1 mL into the muscle every 30 days.     ??? digoxin (LANOXIN) 250 mcg tablet Take 1 Tab by mouth daily. 2 Tab 0   ??? finasteride (PROSCAR) 5 mg tablet Take 5 mg by mouth at bedtime daily.     ??? fluticasone (FLONASE) 50 mcg/Actuation nasal spray Apply 1 spray to each nostril as directed at bedtime daily.     ??? furosemide (LASIX) 20 mg tablet Take 60 mg by mouth daily.     ??? guaiFENesin LA (MUCINEX) 600 mg tablet Take 600 mg by mouth at bedtime daily.     ??? HYDROcodone/acetaminophen (NORCO) 5/325 mg tablet Take 1 tablet by mouth every 6 hours as needed for Pain 10 tablet 0   ??? levothyroxine (SYNTHROID) 75 mcg tablet Take 1 Tab by mouth daily. 30 Tab 0   ??? Lidocaine 4 % ptmd Apply 1 patch topically to affected area daily as needed.     ??? losartan(+) (COZAAR) 100 mg tablet Take 50 mg by mouth daily.     ??? metoprolol XL (TOPROL XL) 100 mg extended release tablet Take 100 mg by mouth twice daily.     ??? MYRBETRIQ 50 mg tablet Take 1 tablet by mouth daily. 90 tablet 3   ??? nitroglycerin (NITROSTAT) 0.4 mg tablet 1 Tab As Needed for Chest Pain. 25 Tab 0   ??? potassium chloride SR (K-DUR) 20 mEq tablet Take 20 mEq by mouth daily. Take with a meal and a full glass of water.     ??? ranitidine(+) (ZANTAC) 150 mg tablet Take 150 mg by mouth at bedtime daily.     ??? Saliva Stimulant Agents Comb.3 (BIOTENE MOISTURIZING MOUTH) spry by Mucous Membrane route daily as needed.     ??? senna/docusate (SENOKOT-S) 8.6/50 mg tablet Take 1 tablet by mouth twice daily as needed.     ??? traMADol (ULTRAM) 50 mg tablet Take 50 mg by mouth every 6 hours as needed for Pain.     ??? trospium ER(+) (SANCTURA XR) 60 mg capsule Take 1 capsule by mouth daily. Do not cut/ crush/ chew 30 capsule 11   ??? vit C,E-Zn-coppr-lutein-zeaxan (PRESERVISION AREDS 2) 250-200-40-1  mg-unit-mg-mg cap Take 1 capsule by mouth twice daily. ??? warfarin (COUMADIN) 2 mg tablet Take 2 Tabs by mouth daily. 4mg  everyday except Sunday is 2mg  (Patient taking differently: Take  by mouth at bedtime daily. TuThSaSu= 3mg   MWF= 4mg ) 4 Tab 0     Allergies:  Amiodarone; Enoxaparin; Heparin analogues; Norpace [disopyramide]; Phenytoin sodium extended; Quinidine; Atorvastatin; Tamsulosin; Aciphex [rabeprazole]; Heparin (bovine); Lisinopril; Mexiletine; Monopril [fosinopril]; Statins [statins-hmg-coa reductase inhibitors]; Zetia [ezetimibe]; Gabapentin; and Niacin    Social History     Social History   ??? Marital status: Married     Spouse name: N/A   ??? Number of children: N/A   ??? Years of education: N/A     Occupational History   ??? Not on file.     Social History Main Topics   ??? Smoking status: Former Smoker     Packs/day: 1.50     Years: 20.00     Types: Cigarettes     Quit date: 02/04/1982   ??? Smokeless tobacco: Never Used   ??? Alcohol use 0.6 - 1.2 oz/week     1 Standard drinks or equivalent per week   ??? Drug use: No   ??? Sexual activity: No     Other Topics Concern   ??? Not on file     Social History Narrative   ??? No narrative on file     Family History   Problem Relation Age of Onset   ??? Heart Failure Mother    ??? Blood Clots Father    ??? Heart Attack Brother 78     Reviewed and non-contributory    Vitals:  Vital Signs: Last Filed In 24 Hours Vital Signs: 24 Hour Range                Intake/Output:  No intake or output data in the 24 hours ending 10/23/16 0913  Physical Exam:   GEN:  Well nourished, A&O, NAD  HEENT:  EOMI, no scleral icterus  CHEST:  Unlabored respirations  CV:  Regular rate  ABD: Soft, nontender and nondistended  EXT:  No C/C/E, strength/sensation grossly in-tact bilaterally  SKIN: No jaundice    ROS:  A 10 point review of systems was reviewed and was negative except for those listed above in HPI    Lab/Radiology/Other Diagnostic Tests:  No results for input(s): HGB, HCT, WBC, PLTCT, NA, K, CL, CO2, BUN, CR, GLU, CA, MG, PO4, ALBUMIN, TOTPROT, TOTBILI, AST, ALT, ALKPHOS, AMY, LIPASE, PREALB, INR, PT, PTT in the last 72 hours.

## 2016-10-23 NOTE — Anesthesia Post-Procedure Evaluation
Post-Anesthesia Evaluation    Name: Joseph Hoover      MRN: [id number]     DOB: 1940/05/11     Age: 76 y.o.     Sex: male   __________________________________________________________________________     Procedure Date: 10/23/2016  Procedure: Procedure(s) with comments:  INCISION OF BLADDER NECK CONTRACTURE WITH XPEEDA - CASE LENGTH 1 HOUR, REQUEST XSPEEDA FIBER      Surgeon: Surgeon(s):  Vonna Drafts, MD  Margot Chimes, MD    Post-Anesthesia Vitals  BP: 111/72 (09/19 1145)  Pulse: 58 (09/19 1145)  Respirations: 13 PER MINUTE (09/19 1145)  SpO2: 97 % (09/19 1145)  SpO2 Pulse: 52 (09/19 1145)      Post Anesthesia Evaluation Note    Evaluation location: Pre/Post  Patient participation: recovered; patient participated in evaluation  Level of consciousness: alert  Pain management: adequate    Hydration: normovolemia  Temperature: 36.0C - 38.4C  Airway patency: adequate    Perioperative Events  Perioperative events:  no       Post-op nausea and vomiting: no PONV    Postoperative Status  Cardiovascular status: hemodynamically stable  Respiratory status: spontaneous ventilation  Follow-up needed: none        Perioperative Events  Perioperative Event: No  Emergency Case Activation: No

## 2016-10-23 NOTE — Other
Brief Operative Note    Name: Joseph Hoover is a 76 y.o. 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     Specimen(s) Removed/Disposition: * No specimens in log *    Complications:  None    Implants: None    Drains: Foley Catheter: 20 mL    Disposition:  PACU - stable    Margot Chimes, MD  Pager [phone]

## 2016-10-24 NOTE — Operative Report(Direct Entry)
OPERATIVE REPORT    Name: Joseph Hoover is a 76 y.o. 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    Description of operative procedure: After informed consent was obtained, the patient was taken to the operating theater and placed supine on the operating table. General anesthesia was induced. He was repositioned to the dorsal lithotomy position and prepped and draped in a standard sterile fashion. Preoperative antibiotics were given. A multidisciplinary time-out was taken. ???  ???  Next, a 26-French continuous flow resectoscope with a visual obturator was advanced through the urethra and encountered significant contracture of the bladder neck preventing entry of scope in to bladder. Previous resection bed was wide open. The visual obturator was removed and replaced with a laser bridge. Using the Xpeeda laser fiber we made two incisions at the 5 and 7 o'clock positions down to the verumontanum. The bladder neck was now wide open and were able to pass the scope in to the bladder.  ???  This concluded the case. The resectoscope was removed and the bladder was drained. A 20 French 2-way Foley catheter was placed with immediate return of clear yellowurine. The bladder irrigated easily without significant clot. General anesthesia was reversed and the patient was transported to the PACU in stable condition. Dr. Zachery Dauer was present and scrubbed for the entire procedure. There were no immediate complications. Estimated Blood Loss:  No blood loss documented.     Margot Chimes, MD  Pager [phone]

## 2016-10-25 ENCOUNTER — Encounter: Admit: 2016-10-25 | Discharge: 2016-10-25 | Payer: MEDICARE

## 2016-10-25 DIAGNOSIS — K219 Gastro-esophageal reflux disease without esophagitis: ICD-10-CM

## 2016-10-25 DIAGNOSIS — I340 Nonrheumatic mitral (valve) insufficiency: ICD-10-CM

## 2016-10-25 DIAGNOSIS — N400 Enlarged prostate without lower urinary tract symptoms: ICD-10-CM

## 2016-10-25 DIAGNOSIS — Z95810 Presence of automatic (implantable) cardiac defibrillator: ICD-10-CM

## 2016-10-25 DIAGNOSIS — R32 Unspecified urinary incontinence: ICD-10-CM

## 2016-10-25 DIAGNOSIS — K469 Unspecified abdominal hernia without obstruction or gangrene: ICD-10-CM

## 2016-10-25 DIAGNOSIS — I219 Acute myocardial infarction, unspecified: ICD-10-CM

## 2016-10-25 DIAGNOSIS — E039 Hypothyroidism, unspecified: ICD-10-CM

## 2016-10-25 DIAGNOSIS — E162 Hypoglycemia, unspecified: ICD-10-CM

## 2016-10-25 DIAGNOSIS — M1990 Unspecified osteoarthritis, unspecified site: ICD-10-CM

## 2016-10-25 DIAGNOSIS — T50905A Adverse effect of unspecified drugs, medicaments and biological substances, initial encounter: ICD-10-CM

## 2016-10-25 DIAGNOSIS — Z9889 Other specified postprocedural states: ICD-10-CM

## 2016-10-25 DIAGNOSIS — Z789 Other specified health status: ICD-10-CM

## 2016-10-25 DIAGNOSIS — I480 Paroxysmal atrial fibrillation: ICD-10-CM

## 2016-10-25 DIAGNOSIS — R040 Epistaxis: ICD-10-CM

## 2016-10-25 DIAGNOSIS — K635 Polyp of colon: ICD-10-CM

## 2016-10-25 DIAGNOSIS — N401 Enlarged prostate with lower urinary tract symptoms: ICD-10-CM

## 2016-10-25 DIAGNOSIS — G4730 Sleep apnea, unspecified: ICD-10-CM

## 2016-10-25 DIAGNOSIS — N529 Male erectile dysfunction, unspecified: ICD-10-CM

## 2016-10-25 DIAGNOSIS — Z8679 Personal history of other diseases of the circulatory system: ICD-10-CM

## 2016-10-25 DIAGNOSIS — Z9229 Personal history of other drug therapy: ICD-10-CM

## 2016-10-25 DIAGNOSIS — N390 Urinary tract infection, site not specified: ICD-10-CM

## 2016-10-25 DIAGNOSIS — I10 Essential (primary) hypertension: ICD-10-CM

## 2016-10-25 DIAGNOSIS — I428 Other cardiomyopathies: ICD-10-CM

## 2016-10-25 DIAGNOSIS — E785 Hyperlipidemia, unspecified: Principal | ICD-10-CM

## 2016-11-19 ENCOUNTER — Encounter: Admit: 2016-11-19 | Discharge: 2016-11-19 | Payer: MEDICARE

## 2016-12-04 ENCOUNTER — Ambulatory Visit: Admit: 2016-12-04 | Discharge: 2016-12-05 | Payer: MEDICARE

## 2016-12-05 DIAGNOSIS — Z95810 Presence of automatic (implantable) cardiac defibrillator: ICD-10-CM

## 2016-12-05 DIAGNOSIS — I428 Other cardiomyopathies: Principal | ICD-10-CM

## 2016-12-05 DIAGNOSIS — I472 Ventricular tachycardia: ICD-10-CM

## 2016-12-10 ENCOUNTER — Encounter: Admit: 2016-12-10 | Discharge: 2016-12-10 | Payer: MEDICARE

## 2016-12-10 DIAGNOSIS — K219 Gastro-esophageal reflux disease without esophagitis: ICD-10-CM

## 2016-12-10 DIAGNOSIS — I10 Essential (primary) hypertension: ICD-10-CM

## 2016-12-10 DIAGNOSIS — G4730 Sleep apnea, unspecified: ICD-10-CM

## 2016-12-10 DIAGNOSIS — E785 Hyperlipidemia, unspecified: Principal | ICD-10-CM

## 2016-12-10 DIAGNOSIS — E039 Hypothyroidism, unspecified: ICD-10-CM

## 2016-12-10 DIAGNOSIS — K469 Unspecified abdominal hernia without obstruction or gangrene: ICD-10-CM

## 2016-12-10 DIAGNOSIS — I472 Ventricular tachycardia: Secondary | ICD-10-CM

## 2016-12-10 DIAGNOSIS — I219 Acute myocardial infarction, unspecified: ICD-10-CM

## 2016-12-10 DIAGNOSIS — Z8679 Personal history of other diseases of the circulatory system: ICD-10-CM

## 2016-12-10 DIAGNOSIS — R32 Unspecified urinary incontinence: ICD-10-CM

## 2016-12-10 DIAGNOSIS — M1990 Unspecified osteoarthritis, unspecified site: ICD-10-CM

## 2016-12-10 DIAGNOSIS — I480 Paroxysmal atrial fibrillation: Secondary | ICD-10-CM

## 2016-12-10 DIAGNOSIS — N529 Male erectile dysfunction, unspecified: ICD-10-CM

## 2016-12-10 DIAGNOSIS — Z95810 Presence of automatic (implantable) cardiac defibrillator: ICD-10-CM

## 2016-12-10 DIAGNOSIS — N400 Enlarged prostate without lower urinary tract symptoms: ICD-10-CM

## 2016-12-10 DIAGNOSIS — R040 Epistaxis: ICD-10-CM

## 2016-12-10 DIAGNOSIS — Z9229 Personal history of other drug therapy: ICD-10-CM

## 2016-12-10 DIAGNOSIS — N390 Urinary tract infection, site not specified: ICD-10-CM

## 2016-12-10 DIAGNOSIS — N401 Enlarged prostate with lower urinary tract symptoms: ICD-10-CM

## 2016-12-10 DIAGNOSIS — E162 Hypoglycemia, unspecified: ICD-10-CM

## 2016-12-10 DIAGNOSIS — I340 Nonrheumatic mitral (valve) insufficiency: ICD-10-CM

## 2016-12-10 DIAGNOSIS — Z789 Other specified health status: ICD-10-CM

## 2016-12-10 DIAGNOSIS — I428 Other cardiomyopathies: ICD-10-CM

## 2016-12-10 DIAGNOSIS — K635 Polyp of colon: ICD-10-CM

## 2016-12-10 DIAGNOSIS — T50905A Adverse effect of unspecified drugs, medicaments and biological substances, initial encounter: ICD-10-CM

## 2016-12-10 DIAGNOSIS — Z9889 Other specified postprocedural states: ICD-10-CM

## 2016-12-11 ENCOUNTER — Ambulatory Visit: Admit: 2016-12-10 | Discharge: 2016-12-11 | Payer: MEDICARE

## 2016-12-11 DIAGNOSIS — I428 Other cardiomyopathies: Principal | ICD-10-CM

## 2016-12-11 DIAGNOSIS — I493 Ventricular premature depolarization: ICD-10-CM

## 2016-12-11 DIAGNOSIS — Z952 Presence of prosthetic heart valve: ICD-10-CM

## 2016-12-11 DIAGNOSIS — I4891 Unspecified atrial fibrillation: ICD-10-CM

## 2017-02-17 ENCOUNTER — Encounter: Admit: 2017-02-17 | Discharge: 2017-02-17 | Payer: MEDICARE

## 2017-02-19 ENCOUNTER — Encounter: Admit: 2017-02-19 | Discharge: 2017-02-19 | Payer: MEDICARE

## 2017-02-21 ENCOUNTER — Ambulatory Visit: Admit: 2017-02-21 | Discharge: 2017-02-21 | Payer: MEDICARE

## 2017-02-21 ENCOUNTER — Encounter: Admit: 2017-02-21 | Discharge: 2017-02-21 | Payer: MEDICARE

## 2017-02-21 DIAGNOSIS — Z8679 Personal history of other diseases of the circulatory system: ICD-10-CM

## 2017-02-21 DIAGNOSIS — R0600 Dyspnea, unspecified: Principal | ICD-10-CM

## 2017-02-25 ENCOUNTER — Encounter: Admit: 2017-02-25 | Discharge: 2017-02-25 | Payer: MEDICARE

## 2017-02-28 ENCOUNTER — Ambulatory Visit: Admit: 2017-02-28 | Discharge: 2017-03-01 | Payer: MEDICARE

## 2017-02-28 DIAGNOSIS — R0600 Dyspnea, unspecified: Principal | ICD-10-CM

## 2017-02-28 DIAGNOSIS — Z8679 Personal history of other diseases of the circulatory system: ICD-10-CM

## 2017-02-28 MED ORDER — NITROGLYCERIN 0.4 MG SL SUBL
.4 mg | SUBLINGUAL | 0 refills | Status: AC | PRN
Start: 2017-02-28 — End: ?

## 2017-02-28 MED ORDER — ALBUTEROL SULFATE 90 MCG/ACTUATION IN HFAA
2 | RESPIRATORY_TRACT | 0 refills | Status: DC | PRN
Start: 2017-02-28 — End: 2017-03-05

## 2017-02-28 MED ORDER — REGADENOSON 0.4 MG/5 ML IV SYRG
.4 mg | Freq: Once | INTRAVENOUS | 0 refills | Status: CP
Start: 2017-02-28 — End: ?

## 2017-02-28 MED ORDER — AMINOPHYLLINE 500 MG/20 ML IV SOLN
50 mg | INTRAVENOUS | 0 refills | Status: AC | PRN
Start: 2017-02-28 — End: ?

## 2017-02-28 MED ORDER — SODIUM CHLORIDE 0.9 % IV SOLP
250 mL | INTRAVENOUS | 0 refills | Status: AC | PRN
Start: 2017-02-28 — End: ?

## 2017-03-05 ENCOUNTER — Ambulatory Visit: Admit: 2017-03-05 | Discharge: 2017-03-06 | Payer: MEDICARE

## 2017-03-05 DIAGNOSIS — I480 Paroxysmal atrial fibrillation: Principal | ICD-10-CM

## 2017-03-21 ENCOUNTER — Encounter: Admit: 2017-03-21 | Discharge: 2017-03-21 | Payer: MEDICARE

## 2017-03-21 DIAGNOSIS — R001 Bradycardia, unspecified: Principal | ICD-10-CM

## 2017-03-21 DIAGNOSIS — I4819 Other persistent atrial fibrillation: ICD-10-CM

## 2017-03-21 DIAGNOSIS — Z95810 Presence of automatic (implantable) cardiac defibrillator: Principal | ICD-10-CM

## 2017-03-21 MED ORDER — CEFAZOLIN INJ 1GM IVP
2 g | Freq: Once | INTRAVENOUS | 0 refills | Status: CN
Start: 2017-03-21 — End: ?

## 2017-03-21 MED ORDER — CEFAZOLIN INJ 1GM IVP
1 g | INTRAVENOUS | 0 refills | Status: CN
Start: 2017-03-21 — End: ?

## 2017-03-24 DIAGNOSIS — I2510 Atherosclerotic heart disease of native coronary artery without angina pectoris: Secondary | ICD-10-CM

## 2017-03-24 DIAGNOSIS — Z4502 Encounter for adjustment and management of automatic implantable cardiac defibrillator: ICD-10-CM

## 2017-03-26 ENCOUNTER — Encounter: Admit: 2017-03-26 | Discharge: 2017-03-26 | Payer: MEDICARE

## 2017-03-28 ENCOUNTER — Ambulatory Visit: Admit: 2017-03-28 | Discharge: 2017-03-28 | Payer: MEDICARE

## 2017-03-28 ENCOUNTER — Encounter: Admit: 2017-03-28 | Discharge: 2017-03-28 | Payer: MEDICARE

## 2017-03-28 DIAGNOSIS — I340 Nonrheumatic mitral (valve) insufficiency: ICD-10-CM

## 2017-03-28 DIAGNOSIS — K635 Polyp of colon: ICD-10-CM

## 2017-03-28 DIAGNOSIS — N529 Male erectile dysfunction, unspecified: ICD-10-CM

## 2017-03-28 DIAGNOSIS — N390 Urinary tract infection, site not specified: ICD-10-CM

## 2017-03-28 DIAGNOSIS — E785 Hyperlipidemia, unspecified: Principal | ICD-10-CM

## 2017-03-28 DIAGNOSIS — I4819 Other persistent atrial fibrillation: ICD-10-CM

## 2017-03-28 DIAGNOSIS — Z9889 Other specified postprocedural states: ICD-10-CM

## 2017-03-28 DIAGNOSIS — K469 Unspecified abdominal hernia without obstruction or gangrene: ICD-10-CM

## 2017-03-28 DIAGNOSIS — E162 Hypoglycemia, unspecified: ICD-10-CM

## 2017-03-28 DIAGNOSIS — T50905A Adverse effect of unspecified drugs, medicaments and biological substances, initial encounter: ICD-10-CM

## 2017-03-28 DIAGNOSIS — M1990 Unspecified osteoarthritis, unspecified site: ICD-10-CM

## 2017-03-28 DIAGNOSIS — I219 Acute myocardial infarction, unspecified: ICD-10-CM

## 2017-03-28 DIAGNOSIS — Z95810 Presence of automatic (implantable) cardiac defibrillator: Principal | ICD-10-CM

## 2017-03-28 DIAGNOSIS — I428 Other cardiomyopathies: ICD-10-CM

## 2017-03-28 DIAGNOSIS — K219 Gastro-esophageal reflux disease without esophagitis: ICD-10-CM

## 2017-03-28 DIAGNOSIS — N400 Enlarged prostate without lower urinary tract symptoms: ICD-10-CM

## 2017-03-28 DIAGNOSIS — N401 Enlarged prostate with lower urinary tract symptoms: ICD-10-CM

## 2017-03-28 DIAGNOSIS — E039 Hypothyroidism, unspecified: ICD-10-CM

## 2017-03-28 DIAGNOSIS — I10 Essential (primary) hypertension: ICD-10-CM

## 2017-03-28 DIAGNOSIS — R32 Unspecified urinary incontinence: ICD-10-CM

## 2017-03-28 DIAGNOSIS — I480 Paroxysmal atrial fibrillation: ICD-10-CM

## 2017-03-28 DIAGNOSIS — G4730 Sleep apnea, unspecified: ICD-10-CM

## 2017-03-28 DIAGNOSIS — Z9229 Personal history of other drug therapy: ICD-10-CM

## 2017-03-28 DIAGNOSIS — R040 Epistaxis: ICD-10-CM

## 2017-03-28 DIAGNOSIS — Z8679 Personal history of other diseases of the circulatory system: ICD-10-CM

## 2017-03-28 DIAGNOSIS — Z789 Other specified health status: ICD-10-CM

## 2017-03-28 LAB — BASIC METABOLIC PANEL
Lab: 27
Lab: 0.9
Lab: 105
Lab: 113 — ABNORMAL HIGH (ref 83–110)
Lab: 14
Lab: 142
Lab: 15 — ABNORMAL HIGH (ref 0–14)
Lab: 4.5
Lab: 81
Lab: 9.9

## 2017-03-28 LAB — MAGNESIUM: Lab: 2.5

## 2017-03-28 MED ORDER — ALUMINUM-MAGNESIUM HYDROXIDE 200-200 MG/5 ML PO SUSP
30 mL | ORAL | 0 refills | Status: CN | PRN
Start: 2017-03-28 — End: ?

## 2017-03-28 MED ORDER — ACETAMINOPHEN 325 MG PO TAB
650 mg | ORAL | 0 refills | Status: CN | PRN
Start: 2017-03-28 — End: ?

## 2017-03-28 MED ORDER — POTASSIUM CHLORIDE 20 MEQ/15 ML PO LIQD
40-60 meq | NASOGASTRIC | 0 refills | Status: CN | PRN
Start: 2017-03-28 — End: ?

## 2017-03-28 MED ORDER — TEMAZEPAM 15 MG PO CAP
15 mg | Freq: Every evening | ORAL | 0 refills | Status: CN | PRN
Start: 2017-03-28 — End: ?

## 2017-03-28 MED ORDER — MAGNESIUM SULFATE IN D5W 1 GRAM/100 ML IV PGBK
1 g | INTRAVENOUS | 0 refills | Status: CN | PRN
Start: 2017-03-28 — End: ?

## 2017-03-28 MED ORDER — POTASSIUM CHLORIDE 20 MEQ PO TBTQ
40-60 meq | ORAL | 0 refills | Status: CN | PRN
Start: 2017-03-28 — End: ?

## 2017-03-28 MED ORDER — DOCUSATE SODIUM 100 MG PO CAP
100 mg | Freq: Every day | ORAL | 0 refills | Status: CN | PRN
Start: 2017-03-28 — End: ?

## 2017-03-31 ENCOUNTER — Encounter: Admit: 2017-03-31 | Discharge: 2017-03-31 | Payer: MEDICARE

## 2017-03-31 DIAGNOSIS — I4819 Other persistent atrial fibrillation: Principal | ICD-10-CM

## 2017-03-31 LAB — PROTIME INR (PT): Lab: 3.6 10*6/uL — ABNORMAL LOW (ref 4.00–5.20)

## 2017-04-02 ENCOUNTER — Encounter: Admit: 2017-04-02 | Discharge: 2017-04-02 | Payer: MEDICARE

## 2017-04-02 ENCOUNTER — Encounter: Admit: 2017-04-02 | Discharge: 2017-04-03 | Payer: MEDICARE

## 2017-04-02 ENCOUNTER — Ambulatory Visit: Admit: 2017-04-02 | Discharge: 2017-04-02 | Payer: MEDICARE

## 2017-04-02 LAB — POC PT/INR: Lab: 2.7 — ABNORMAL HIGH (ref 0.8–1.2)

## 2017-04-02 LAB — CBC
Lab: 108 10*3/uL — ABNORMAL LOW (ref 150–400)
Lab: 13 % (ref 11–15)
Lab: 13 g/dL — ABNORMAL LOW (ref 13.5–16.5)
Lab: 31 pg (ref 26–34)
Lab: 33 g/dL (ref 32.0–36.0)
Lab: 39 % — ABNORMAL LOW (ref 40–50)
Lab: 4.1 10*3/uL — ABNORMAL LOW (ref 4.5–11.0)
Lab: 4.1 M/UL — ABNORMAL LOW (ref 4.4–5.5)
Lab: 8.2 FL (ref 7–11)
Lab: 94 FL (ref 80–100)

## 2017-04-02 MED ORDER — HELP MEDICATION
Freq: Once | 0 refills | Status: DC
Start: 2017-04-02 — End: 2017-04-03

## 2017-04-02 MED ORDER — ASPIRIN 81 MG PO TBEC
81 mg | Freq: Every day | ORAL | 0 refills | Status: DC
Start: 2017-04-02 — End: 2017-04-03

## 2017-04-02 MED ORDER — HYDROCODONE-ACETAMINOPHEN 5-325 MG PO TAB
1-2 | ORAL | 0 refills | Status: DC | PRN
Start: 2017-04-02 — End: 2017-04-03
  Administered 2017-04-02 – 2017-04-03 (×3): 1 via ORAL

## 2017-04-02 MED ORDER — DOCUSATE SODIUM 100 MG PO CAP
100 mg | Freq: Every day | ORAL | 0 refills | Status: DC | PRN
Start: 2017-04-02 — End: 2017-04-03

## 2017-04-02 MED ORDER — CEFAZOLIN INJ 1GM IVP
2 g | Freq: Once | INTRAVENOUS | 0 refills | Status: CP
Start: 2017-04-02 — End: ?

## 2017-04-02 MED ORDER — POTASSIUM CHLORIDE 20 MEQ/15 ML PO LIQD
40-60 meq | NASOGASTRIC | 0 refills | Status: DC | PRN
Start: 2017-04-02 — End: 2017-04-03

## 2017-04-02 MED ORDER — TEMAZEPAM 15 MG PO CAP
15 mg | Freq: Every evening | ORAL | 0 refills | Status: DC | PRN
Start: 2017-04-02 — End: 2017-04-03

## 2017-04-02 MED ORDER — FAMOTIDINE 20 MG PO TAB
20 mg | Freq: Every evening | ORAL | 0 refills | Status: DC
Start: 2017-04-02 — End: 2017-04-03
  Administered 2017-04-03: 06:00:00 20 mg via ORAL

## 2017-04-02 MED ORDER — CETIRIZINE 10 MG PO TAB
10 mg | Freq: Every morning | ORAL | 0 refills | Status: DC
Start: 2017-04-02 — End: 2017-04-03
  Administered 2017-04-03: 14:00:00 10 mg via ORAL

## 2017-04-02 MED ORDER — METOPROLOL SUCCINATE 50 MG PO TB24
50 mg | Freq: Once | ORAL | 0 refills | Status: CP
Start: 2017-04-02 — End: ?
  Administered 2017-04-03: 06:00:00 50 mg via ORAL

## 2017-04-02 MED ORDER — MAGNESIUM SULFATE IN D5W 1 GRAM/100 ML IV PGBK
1 g | INTRAVENOUS | 0 refills | Status: DC | PRN
Start: 2017-04-02 — End: 2017-04-03

## 2017-04-02 MED ORDER — WARFARIN 4 MG PO TAB
4 mg | Freq: Once | ORAL | 0 refills | Status: CP
Start: 2017-04-02 — End: ?
  Administered 2017-04-03: 04:00:00 4 mg via ORAL

## 2017-04-02 MED ORDER — ALUMINUM-MAGNESIUM HYDROXIDE 200-200 MG/5 ML PO SUSP
30 mL | ORAL | 0 refills | Status: DC | PRN
Start: 2017-04-02 — End: 2017-04-03

## 2017-04-02 MED ORDER — MIRABEGRON 50 MG PO TB24
50 mg | Freq: Once | ORAL | 0 refills | Status: CP
Start: 2017-04-02 — End: ?
  Administered 2017-04-03: 06:00:00 50 mg via ORAL

## 2017-04-02 MED ORDER — ALBUTEROL SULFATE 90 MCG/ACTUATION IN HFAA
1 | RESPIRATORY_TRACT | 0 refills | Status: DC | PRN
Start: 2017-04-02 — End: 2017-04-03

## 2017-04-02 MED ORDER — DIGOXIN 250 MCG PO TAB
250 ug | Freq: Every day | ORAL | 0 refills | Status: DC
Start: 2017-04-02 — End: 2017-04-03
  Administered 2017-04-03: 14:00:00 250 ug via ORAL

## 2017-04-02 MED ORDER — CEFAZOLIN INJ 1GM IVP
1 g | INTRAVENOUS | 0 refills | Status: CP
Start: 2017-04-02 — End: ?
  Administered 2017-04-03: 02:00:00 1 g via INTRAVENOUS

## 2017-04-02 MED ORDER — ACETAMINOPHEN 325 MG PO TAB
650 mg | ORAL | 0 refills | Status: DC | PRN
Start: 2017-04-02 — End: 2017-04-03
  Administered 2017-04-02: 21:00:00 650 mg via ORAL

## 2017-04-02 MED ORDER — LEVOTHYROXINE 75 MCG PO TAB
75 ug | Freq: Every day | ORAL | 0 refills | Status: DC
Start: 2017-04-02 — End: 2017-04-03
  Administered 2017-04-03: 14:00:00 75 ug via ORAL

## 2017-04-02 MED ORDER — POTASSIUM CHLORIDE 20 MEQ PO TBTQ
40-60 meq | ORAL | 0 refills | Status: DC | PRN
Start: 2017-04-02 — End: 2017-04-03
  Administered 2017-04-03: 14:00:00 40 meq via ORAL

## 2017-04-03 ENCOUNTER — Encounter: Admit: 2017-04-03 | Discharge: 2017-04-03 | Payer: MEDICARE

## 2017-04-03 ENCOUNTER — Encounter: Admit: 2017-04-03 | Discharge: 2017-04-04 | Payer: MEDICARE

## 2017-04-03 DIAGNOSIS — Z4502 Encounter for adjustment and management of automatic implantable cardiac defibrillator: ICD-10-CM

## 2017-04-03 DIAGNOSIS — I10 Essential (primary) hypertension: ICD-10-CM

## 2017-04-03 DIAGNOSIS — I472 Ventricular tachycardia: ICD-10-CM

## 2017-04-03 DIAGNOSIS — I428 Other cardiomyopathies: ICD-10-CM

## 2017-04-03 DIAGNOSIS — Z95810 Presence of automatic (implantable) cardiac defibrillator: ICD-10-CM

## 2017-04-03 DIAGNOSIS — R001 Bradycardia, unspecified: ICD-10-CM

## 2017-04-03 DIAGNOSIS — I420 Dilated cardiomyopathy: Principal | ICD-10-CM

## 2017-04-03 DIAGNOSIS — I2510 Atherosclerotic heart disease of native coronary artery without angina pectoris: ICD-10-CM

## 2017-04-03 LAB — PROTIME INR (PT): Lab: 2.2 MMOL/L — ABNORMAL HIGH (ref 0.8–1.2)

## 2017-04-03 LAB — CBC: Lab: 5.2 K/UL — ABNORMAL LOW (ref >60)

## 2017-04-03 LAB — BASIC METABOLIC PANEL: Lab: 136 MMOL/L — ABNORMAL LOW (ref 137–147)

## 2017-04-03 MED ORDER — CEPHALEXIN 500 MG PO CAP
500 mg | ORAL_CAPSULE | Freq: Four times a day (QID) | ORAL | 0 refills | Status: AC
Start: 2017-04-03 — End: ?

## 2017-04-07 ENCOUNTER — Encounter: Admit: 2017-04-07 | Discharge: 2017-04-07 | Payer: MEDICARE

## 2017-04-07 DIAGNOSIS — K635 Polyp of colon: ICD-10-CM

## 2017-04-07 DIAGNOSIS — E039 Hypothyroidism, unspecified: ICD-10-CM

## 2017-04-07 DIAGNOSIS — Z9889 Other specified postprocedural states: ICD-10-CM

## 2017-04-07 DIAGNOSIS — I10 Essential (primary) hypertension: ICD-10-CM

## 2017-04-07 DIAGNOSIS — I480 Paroxysmal atrial fibrillation: ICD-10-CM

## 2017-04-07 DIAGNOSIS — K219 Gastro-esophageal reflux disease without esophagitis: ICD-10-CM

## 2017-04-07 DIAGNOSIS — N401 Enlarged prostate with lower urinary tract symptoms: ICD-10-CM

## 2017-04-07 DIAGNOSIS — E162 Hypoglycemia, unspecified: ICD-10-CM

## 2017-04-07 DIAGNOSIS — N529 Male erectile dysfunction, unspecified: ICD-10-CM

## 2017-04-07 DIAGNOSIS — I340 Nonrheumatic mitral (valve) insufficiency: ICD-10-CM

## 2017-04-07 DIAGNOSIS — R040 Epistaxis: ICD-10-CM

## 2017-04-07 DIAGNOSIS — M1990 Unspecified osteoarthritis, unspecified site: ICD-10-CM

## 2017-04-07 DIAGNOSIS — R32 Unspecified urinary incontinence: ICD-10-CM

## 2017-04-07 DIAGNOSIS — Z789 Other specified health status: ICD-10-CM

## 2017-04-07 DIAGNOSIS — G4730 Sleep apnea, unspecified: ICD-10-CM

## 2017-04-07 DIAGNOSIS — T50905A Adverse effect of unspecified drugs, medicaments and biological substances, initial encounter: ICD-10-CM

## 2017-04-07 DIAGNOSIS — Z95810 Presence of automatic (implantable) cardiac defibrillator: ICD-10-CM

## 2017-04-07 DIAGNOSIS — K469 Unspecified abdominal hernia without obstruction or gangrene: ICD-10-CM

## 2017-04-07 DIAGNOSIS — E785 Hyperlipidemia, unspecified: Principal | ICD-10-CM

## 2017-04-07 DIAGNOSIS — N390 Urinary tract infection, site not specified: ICD-10-CM

## 2017-04-07 DIAGNOSIS — Z9229 Personal history of other drug therapy: ICD-10-CM

## 2017-04-07 DIAGNOSIS — I219 Acute myocardial infarction, unspecified: ICD-10-CM

## 2017-04-07 DIAGNOSIS — N400 Enlarged prostate without lower urinary tract symptoms: ICD-10-CM

## 2017-04-07 DIAGNOSIS — I428 Other cardiomyopathies: ICD-10-CM

## 2017-04-07 DIAGNOSIS — Z8679 Personal history of other diseases of the circulatory system: ICD-10-CM

## 2017-04-11 ENCOUNTER — Ambulatory Visit: Admit: 2017-04-11 | Discharge: 2017-04-12 | Payer: MEDICARE

## 2017-04-12 DIAGNOSIS — Z95810 Presence of automatic (implantable) cardiac defibrillator: Principal | ICD-10-CM

## 2017-04-18 ENCOUNTER — Ambulatory Visit: Admit: 2017-04-18 | Discharge: 2017-04-19 | Payer: MEDICARE

## 2017-05-08 ENCOUNTER — Encounter: Admit: 2017-05-08 | Discharge: 2017-05-08 | Payer: MEDICARE

## 2017-05-08 DIAGNOSIS — Z9889 Other specified postprocedural states: ICD-10-CM

## 2017-05-08 DIAGNOSIS — I480 Paroxysmal atrial fibrillation: ICD-10-CM

## 2017-05-08 DIAGNOSIS — N401 Enlarged prostate with lower urinary tract symptoms: ICD-10-CM

## 2017-05-08 DIAGNOSIS — R32 Unspecified urinary incontinence: ICD-10-CM

## 2017-05-08 DIAGNOSIS — G4730 Sleep apnea, unspecified: ICD-10-CM

## 2017-05-08 DIAGNOSIS — E162 Hypoglycemia, unspecified: ICD-10-CM

## 2017-05-08 DIAGNOSIS — Z95810 Presence of automatic (implantable) cardiac defibrillator: ICD-10-CM

## 2017-05-08 DIAGNOSIS — E039 Hypothyroidism, unspecified: ICD-10-CM

## 2017-05-08 DIAGNOSIS — I340 Nonrheumatic mitral (valve) insufficiency: ICD-10-CM

## 2017-05-08 DIAGNOSIS — Z9229 Personal history of other drug therapy: ICD-10-CM

## 2017-05-08 DIAGNOSIS — N390 Urinary tract infection, site not specified: ICD-10-CM

## 2017-05-08 DIAGNOSIS — Z8679 Personal history of other diseases of the circulatory system: ICD-10-CM

## 2017-05-08 DIAGNOSIS — M1990 Unspecified osteoarthritis, unspecified site: ICD-10-CM

## 2017-05-08 DIAGNOSIS — I219 Acute myocardial infarction, unspecified: ICD-10-CM

## 2017-05-08 DIAGNOSIS — I10 Essential (primary) hypertension: ICD-10-CM

## 2017-05-08 DIAGNOSIS — T50905A Adverse effect of unspecified drugs, medicaments and biological substances, initial encounter: ICD-10-CM

## 2017-05-08 DIAGNOSIS — N400 Enlarged prostate without lower urinary tract symptoms: ICD-10-CM

## 2017-05-08 DIAGNOSIS — R040 Epistaxis: ICD-10-CM

## 2017-05-08 DIAGNOSIS — I428 Other cardiomyopathies: ICD-10-CM

## 2017-05-08 DIAGNOSIS — K219 Gastro-esophageal reflux disease without esophagitis: ICD-10-CM

## 2017-05-08 DIAGNOSIS — E785 Hyperlipidemia, unspecified: Principal | ICD-10-CM

## 2017-05-08 DIAGNOSIS — Z789 Other specified health status: ICD-10-CM

## 2017-05-08 DIAGNOSIS — K635 Polyp of colon: ICD-10-CM

## 2017-05-08 DIAGNOSIS — K469 Unspecified abdominal hernia without obstruction or gangrene: ICD-10-CM

## 2017-05-08 DIAGNOSIS — N529 Male erectile dysfunction, unspecified: ICD-10-CM

## 2017-05-09 ENCOUNTER — Encounter: Admit: 2017-05-09 | Discharge: 2017-05-09 | Payer: MEDICARE

## 2017-05-09 ENCOUNTER — Ambulatory Visit: Admit: 2017-05-09 | Discharge: 2017-05-09 | Payer: MEDICARE

## 2017-05-09 DIAGNOSIS — Z9229 Personal history of other drug therapy: ICD-10-CM

## 2017-05-09 DIAGNOSIS — Z9889 Other specified postprocedural states: ICD-10-CM

## 2017-05-09 DIAGNOSIS — K469 Unspecified abdominal hernia without obstruction or gangrene: ICD-10-CM

## 2017-05-09 DIAGNOSIS — E785 Hyperlipidemia, unspecified: Principal | ICD-10-CM

## 2017-05-09 DIAGNOSIS — I481 Persistent atrial fibrillation: Principal | ICD-10-CM

## 2017-05-09 DIAGNOSIS — I480 Paroxysmal atrial fibrillation: ICD-10-CM

## 2017-05-09 DIAGNOSIS — N390 Urinary tract infection, site not specified: ICD-10-CM

## 2017-05-09 DIAGNOSIS — R32 Unspecified urinary incontinence: ICD-10-CM

## 2017-05-09 DIAGNOSIS — T50905A Adverse effect of unspecified drugs, medicaments and biological substances, initial encounter: ICD-10-CM

## 2017-05-09 DIAGNOSIS — Z95810 Presence of automatic (implantable) cardiac defibrillator: ICD-10-CM

## 2017-05-09 DIAGNOSIS — E162 Hypoglycemia, unspecified: ICD-10-CM

## 2017-05-09 DIAGNOSIS — K219 Gastro-esophageal reflux disease without esophagitis: ICD-10-CM

## 2017-05-09 DIAGNOSIS — G4730 Sleep apnea, unspecified: ICD-10-CM

## 2017-05-09 DIAGNOSIS — R040 Epistaxis: ICD-10-CM

## 2017-05-09 DIAGNOSIS — N529 Male erectile dysfunction, unspecified: ICD-10-CM

## 2017-05-09 DIAGNOSIS — K635 Polyp of colon: ICD-10-CM

## 2017-05-09 DIAGNOSIS — I10 Essential (primary) hypertension: ICD-10-CM

## 2017-05-09 DIAGNOSIS — N400 Enlarged prostate without lower urinary tract symptoms: ICD-10-CM

## 2017-05-09 DIAGNOSIS — N401 Enlarged prostate with lower urinary tract symptoms: ICD-10-CM

## 2017-05-09 DIAGNOSIS — T82190A Other mechanical complication of cardiac electrode, initial encounter: ICD-10-CM

## 2017-05-09 DIAGNOSIS — I428 Other cardiomyopathies: ICD-10-CM

## 2017-05-09 DIAGNOSIS — E039 Hypothyroidism, unspecified: ICD-10-CM

## 2017-05-09 DIAGNOSIS — I219 Acute myocardial infarction, unspecified: ICD-10-CM

## 2017-05-09 DIAGNOSIS — I340 Nonrheumatic mitral (valve) insufficiency: ICD-10-CM

## 2017-05-09 DIAGNOSIS — Z8679 Personal history of other diseases of the circulatory system: ICD-10-CM

## 2017-05-09 DIAGNOSIS — I5022 Chronic systolic (congestive) heart failure: ICD-10-CM

## 2017-05-09 DIAGNOSIS — Z789 Other specified health status: ICD-10-CM

## 2017-05-09 DIAGNOSIS — M1990 Unspecified osteoarthritis, unspecified site: ICD-10-CM

## 2017-06-04 ENCOUNTER — Ambulatory Visit: Admit: 2017-06-04 | Discharge: 2017-06-05 | Payer: MEDICARE

## 2017-06-05 DIAGNOSIS — I428 Other cardiomyopathies: Principal | ICD-10-CM

## 2017-06-05 DIAGNOSIS — Z95810 Presence of automatic (implantable) cardiac defibrillator: ICD-10-CM

## 2017-07-16 ENCOUNTER — Encounter: Admit: 2017-07-16 | Discharge: 2017-07-16 | Payer: MEDICARE

## 2017-07-16 ENCOUNTER — Ambulatory Visit: Admit: 2017-07-16 | Discharge: 2017-07-17 | Payer: MEDICARE

## 2017-07-16 DIAGNOSIS — I4819 Other persistent atrial fibrillation: Principal | ICD-10-CM

## 2017-07-16 DIAGNOSIS — Z95810 Presence of automatic (implantable) cardiac defibrillator: Principal | ICD-10-CM

## 2017-07-16 DIAGNOSIS — I5022 Chronic systolic (congestive) heart failure: ICD-10-CM

## 2017-07-16 MED ORDER — PERFLUTREN LIPID MICROSPHERES 1.1 MG/ML IV SUSP
1-20 mL | Freq: Once | INTRAVENOUS | 0 refills | Status: CP | PRN
Start: 2017-07-16 — End: ?

## 2017-07-17 ENCOUNTER — Ambulatory Visit: Admit: 2017-07-17 | Discharge: 2017-07-18 | Payer: MEDICARE

## 2017-07-18 DIAGNOSIS — I472 Ventricular tachycardia: Principal | ICD-10-CM

## 2017-07-18 DIAGNOSIS — Z95810 Presence of automatic (implantable) cardiac defibrillator: ICD-10-CM

## 2017-07-23 ENCOUNTER — Encounter: Admit: 2017-07-23 | Discharge: 2017-07-23 | Payer: MEDICARE

## 2017-07-23 DIAGNOSIS — I428 Other cardiomyopathies: Principal | ICD-10-CM

## 2017-07-23 DIAGNOSIS — Z95810 Presence of automatic (implantable) cardiac defibrillator: ICD-10-CM

## 2017-07-25 ENCOUNTER — Ambulatory Visit: Admit: 2017-07-25 | Discharge: 2017-07-26 | Payer: MEDICARE

## 2017-07-25 DIAGNOSIS — Z95810 Presence of automatic (implantable) cardiac defibrillator: ICD-10-CM

## 2017-07-25 DIAGNOSIS — I428 Other cardiomyopathies: Principal | ICD-10-CM

## 2017-07-29 ENCOUNTER — Encounter: Admit: 2017-07-29 | Discharge: 2017-07-29 | Payer: MEDICARE

## 2017-08-13 ENCOUNTER — Encounter: Admit: 2017-08-13 | Discharge: 2017-08-13 | Payer: MEDICARE

## 2017-08-13 ENCOUNTER — Ambulatory Visit: Admit: 2017-08-13 | Discharge: 2017-08-14 | Payer: MEDICARE

## 2017-08-13 DIAGNOSIS — N401 Enlarged prostate with lower urinary tract symptoms: ICD-10-CM

## 2017-08-13 DIAGNOSIS — I4819 Other persistent atrial fibrillation: ICD-10-CM

## 2017-08-13 DIAGNOSIS — R040 Epistaxis: ICD-10-CM

## 2017-08-13 DIAGNOSIS — Z9889 Other specified postprocedural states: ICD-10-CM

## 2017-08-13 DIAGNOSIS — M1990 Unspecified osteoarthritis, unspecified site: ICD-10-CM

## 2017-08-13 DIAGNOSIS — N529 Male erectile dysfunction, unspecified: ICD-10-CM

## 2017-08-13 DIAGNOSIS — N400 Enlarged prostate without lower urinary tract symptoms: ICD-10-CM

## 2017-08-13 DIAGNOSIS — I10 Essential (primary) hypertension: ICD-10-CM

## 2017-08-13 DIAGNOSIS — G4730 Sleep apnea, unspecified: ICD-10-CM

## 2017-08-13 DIAGNOSIS — Z8679 Personal history of other diseases of the circulatory system: ICD-10-CM

## 2017-08-13 DIAGNOSIS — R32 Unspecified urinary incontinence: ICD-10-CM

## 2017-08-13 DIAGNOSIS — I340 Nonrheumatic mitral (valve) insufficiency: ICD-10-CM

## 2017-08-13 DIAGNOSIS — Z789 Other specified health status: ICD-10-CM

## 2017-08-13 DIAGNOSIS — I428 Other cardiomyopathies: Principal | ICD-10-CM

## 2017-08-13 DIAGNOSIS — Z95810 Presence of automatic (implantable) cardiac defibrillator: ICD-10-CM

## 2017-08-13 DIAGNOSIS — N390 Urinary tract infection, site not specified: ICD-10-CM

## 2017-08-13 DIAGNOSIS — K469 Unspecified abdominal hernia without obstruction or gangrene: ICD-10-CM

## 2017-08-13 DIAGNOSIS — K219 Gastro-esophageal reflux disease without esophagitis: ICD-10-CM

## 2017-08-13 DIAGNOSIS — K635 Polyp of colon: ICD-10-CM

## 2017-08-13 DIAGNOSIS — E039 Hypothyroidism, unspecified: ICD-10-CM

## 2017-08-13 DIAGNOSIS — E162 Hypoglycemia, unspecified: ICD-10-CM

## 2017-08-13 DIAGNOSIS — T50905A Adverse effect of unspecified drugs, medicaments and biological substances, initial encounter: ICD-10-CM

## 2017-08-13 DIAGNOSIS — I480 Paroxysmal atrial fibrillation: ICD-10-CM

## 2017-08-13 DIAGNOSIS — E785 Hyperlipidemia, unspecified: Principal | ICD-10-CM

## 2017-08-13 DIAGNOSIS — I219 Acute myocardial infarction, unspecified: ICD-10-CM

## 2017-08-13 DIAGNOSIS — Z9229 Personal history of other drug therapy: ICD-10-CM

## 2017-09-03 ENCOUNTER — Ambulatory Visit: Admit: 2017-09-03 | Discharge: 2017-09-04 | Payer: MEDICARE

## 2017-09-04 DIAGNOSIS — Z95810 Presence of automatic (implantable) cardiac defibrillator: ICD-10-CM

## 2017-09-04 DIAGNOSIS — I428 Other cardiomyopathies: Principal | ICD-10-CM

## 2017-12-03 ENCOUNTER — Ambulatory Visit: Admit: 2017-12-03 | Discharge: 2017-12-04 | Payer: MEDICARE

## 2017-12-04 DIAGNOSIS — Z95810 Presence of automatic (implantable) cardiac defibrillator: ICD-10-CM

## 2017-12-04 DIAGNOSIS — I428 Other cardiomyopathies: Principal | ICD-10-CM

## 2018-01-19 ENCOUNTER — Encounter: Admit: 2018-01-19 | Discharge: 2018-01-19 | Payer: MEDICARE

## 2018-01-20 ENCOUNTER — Ambulatory Visit: Admit: 2018-01-20 | Discharge: 2018-01-21 | Payer: MEDICARE

## 2018-01-20 ENCOUNTER — Encounter: Admit: 2018-01-20 | Discharge: 2018-01-20 | Payer: MEDICARE

## 2018-01-20 DIAGNOSIS — Z9229 Personal history of other drug therapy: ICD-10-CM

## 2018-01-20 DIAGNOSIS — T50905A Adverse effect of unspecified drugs, medicaments and biological substances, initial encounter: ICD-10-CM

## 2018-01-20 DIAGNOSIS — N529 Male erectile dysfunction, unspecified: ICD-10-CM

## 2018-01-20 DIAGNOSIS — N401 Enlarged prostate with lower urinary tract symptoms: ICD-10-CM

## 2018-01-20 DIAGNOSIS — Z7901 Long term (current) use of anticoagulants: Secondary | ICD-10-CM

## 2018-01-20 DIAGNOSIS — M1990 Unspecified osteoarthritis, unspecified site: ICD-10-CM

## 2018-01-20 DIAGNOSIS — I340 Nonrheumatic mitral (valve) insufficiency: ICD-10-CM

## 2018-01-20 DIAGNOSIS — K635 Polyp of colon: ICD-10-CM

## 2018-01-20 DIAGNOSIS — Z8679 Personal history of other diseases of the circulatory system: ICD-10-CM

## 2018-01-20 DIAGNOSIS — N400 Enlarged prostate without lower urinary tract symptoms: ICD-10-CM

## 2018-01-20 DIAGNOSIS — K469 Unspecified abdominal hernia without obstruction or gangrene: ICD-10-CM

## 2018-01-20 DIAGNOSIS — E785 Hyperlipidemia, unspecified: Principal | ICD-10-CM

## 2018-01-20 DIAGNOSIS — Z952 Presence of prosthetic heart valve: Secondary | ICD-10-CM

## 2018-01-20 DIAGNOSIS — I428 Other cardiomyopathies: ICD-10-CM

## 2018-01-20 DIAGNOSIS — I10 Essential (primary) hypertension: ICD-10-CM

## 2018-01-20 DIAGNOSIS — Z95810 Presence of automatic (implantable) cardiac defibrillator: ICD-10-CM

## 2018-01-20 DIAGNOSIS — I219 Acute myocardial infarction, unspecified: ICD-10-CM

## 2018-01-20 DIAGNOSIS — Z789 Other specified health status: ICD-10-CM

## 2018-01-20 DIAGNOSIS — K219 Gastro-esophageal reflux disease without esophagitis: ICD-10-CM

## 2018-01-20 DIAGNOSIS — E162 Hypoglycemia, unspecified: ICD-10-CM

## 2018-01-20 DIAGNOSIS — R040 Epistaxis: ICD-10-CM

## 2018-01-20 DIAGNOSIS — G4730 Sleep apnea, unspecified: ICD-10-CM

## 2018-01-20 DIAGNOSIS — I480 Paroxysmal atrial fibrillation: ICD-10-CM

## 2018-01-20 DIAGNOSIS — R32 Unspecified urinary incontinence: ICD-10-CM

## 2018-01-20 DIAGNOSIS — N390 Urinary tract infection, site not specified: ICD-10-CM

## 2018-01-20 DIAGNOSIS — Z9889 Other specified postprocedural states: ICD-10-CM

## 2018-01-20 DIAGNOSIS — E039 Hypothyroidism, unspecified: ICD-10-CM

## 2018-01-20 MED ORDER — SPIRONOLACTONE 25 MG PO TAB
25 mg | ORAL_TABLET | Freq: Every day | ORAL | 3 refills | 46.00000 days | Status: AC
Start: 2018-01-20 — End: ?

## 2018-01-20 MED ORDER — SACUBITRIL-VALSARTAN 24-26 MG PO TAB
1 | ORAL_TABLET | Freq: Two times a day (BID) | ORAL | 11 refills | Status: AC
Start: 2018-01-20 — End: 2018-04-01

## 2018-01-21 ENCOUNTER — Encounter: Admit: 2018-01-21 | Discharge: 2018-01-21 | Payer: MEDICARE

## 2018-01-21 DIAGNOSIS — Z95810 Presence of automatic (implantable) cardiac defibrillator: ICD-10-CM

## 2018-01-21 DIAGNOSIS — I428 Other cardiomyopathies: Secondary | ICD-10-CM

## 2018-01-21 DIAGNOSIS — I2510 Atherosclerotic heart disease of native coronary artery without angina pectoris: ICD-10-CM

## 2018-01-21 DIAGNOSIS — I340 Nonrheumatic mitral (valve) insufficiency: ICD-10-CM

## 2018-01-21 DIAGNOSIS — I4819 Other persistent atrial fibrillation: ICD-10-CM

## 2018-01-21 DIAGNOSIS — I10 Essential (primary) hypertension: ICD-10-CM

## 2018-01-21 DIAGNOSIS — I4821 Permanent atrial fibrillation: ICD-10-CM

## 2018-01-21 DIAGNOSIS — I5020 Unspecified systolic (congestive) heart failure: Principal | ICD-10-CM

## 2018-01-21 DIAGNOSIS — G4733 Obstructive sleep apnea (adult) (pediatric): ICD-10-CM

## 2018-01-21 DIAGNOSIS — E785 Hyperlipidemia, unspecified: Secondary | ICD-10-CM

## 2018-01-21 DIAGNOSIS — I110 Hypertensive heart disease with heart failure: ICD-10-CM

## 2018-01-21 DIAGNOSIS — I420 Dilated cardiomyopathy: ICD-10-CM

## 2018-01-21 DIAGNOSIS — Z8679 Personal history of other diseases of the circulatory system: Secondary | ICD-10-CM

## 2018-02-03 LAB — BASIC METABOLIC PANEL
Lab: 1 % (ref 4–12)
Lab: 105 FL (ref 7–11)
Lab: 12 10*3/uL (ref 0–0.80)
Lab: 140 % (ref 11–15)
Lab: 15 % (ref 24–44)
Lab: 28 % (ref 41–77)
Lab: 4.9 K/UL (ref 150–400)
Lab: 85 % — ABNORMAL LOW (ref >60)
Lab: 9.9 % (ref >60)

## 2018-02-05 ENCOUNTER — Encounter: Admit: 2018-02-05 | Discharge: 2018-02-05 | Payer: MEDICARE

## 2018-02-05 DIAGNOSIS — I428 Other cardiomyopathies: Secondary | ICD-10-CM

## 2018-02-05 DIAGNOSIS — I4819 Other persistent atrial fibrillation: Secondary | ICD-10-CM

## 2018-02-05 DIAGNOSIS — Z952 Presence of prosthetic heart valve: Secondary | ICD-10-CM

## 2018-02-05 DIAGNOSIS — I340 Nonrheumatic mitral (valve) insufficiency: Secondary | ICD-10-CM

## 2018-02-05 DIAGNOSIS — I10 Essential (primary) hypertension: Secondary | ICD-10-CM

## 2018-02-05 DIAGNOSIS — I2510 Atherosclerotic heart disease of native coronary artery without angina pectoris: Secondary | ICD-10-CM

## 2018-03-04 ENCOUNTER — Encounter: Admit: 2018-03-04 | Discharge: 2018-03-04 | Payer: MEDICARE

## 2018-03-04 ENCOUNTER — Ambulatory Visit: Admit: 2018-03-04 | Discharge: 2018-03-05 | Payer: MEDICARE

## 2018-03-04 DIAGNOSIS — I428 Other cardiomyopathies: Secondary | ICD-10-CM

## 2018-03-04 DIAGNOSIS — I4819 Other persistent atrial fibrillation: Secondary | ICD-10-CM

## 2018-03-04 DIAGNOSIS — Z95810 Presence of automatic (implantable) cardiac defibrillator: Secondary | ICD-10-CM

## 2018-03-05 DIAGNOSIS — Z95810 Presence of automatic (implantable) cardiac defibrillator: Secondary | ICD-10-CM

## 2018-03-05 DIAGNOSIS — I428 Other cardiomyopathies: Secondary | ICD-10-CM

## 2018-03-05 DIAGNOSIS — I472 Ventricular tachycardia: Secondary | ICD-10-CM

## 2018-04-01 ENCOUNTER — Encounter: Admit: 2018-04-01 | Discharge: 2018-04-01 | Payer: MEDICARE

## 2018-04-01 DIAGNOSIS — E162 Hypoglycemia, unspecified: ICD-10-CM

## 2018-04-01 DIAGNOSIS — N529 Male erectile dysfunction, unspecified: ICD-10-CM

## 2018-04-01 DIAGNOSIS — M1990 Unspecified osteoarthritis, unspecified site: ICD-10-CM

## 2018-04-01 DIAGNOSIS — K635 Polyp of colon: ICD-10-CM

## 2018-04-01 DIAGNOSIS — R040 Epistaxis: ICD-10-CM

## 2018-04-01 DIAGNOSIS — Z95810 Presence of automatic (implantable) cardiac defibrillator: ICD-10-CM

## 2018-04-01 DIAGNOSIS — K469 Unspecified abdominal hernia without obstruction or gangrene: ICD-10-CM

## 2018-04-01 DIAGNOSIS — N400 Enlarged prostate without lower urinary tract symptoms: ICD-10-CM

## 2018-04-01 DIAGNOSIS — I480 Paroxysmal atrial fibrillation: ICD-10-CM

## 2018-04-01 DIAGNOSIS — E039 Hypothyroidism, unspecified: ICD-10-CM

## 2018-04-01 DIAGNOSIS — E785 Hyperlipidemia, unspecified: Principal | ICD-10-CM

## 2018-04-01 DIAGNOSIS — I4821 Permanent atrial fibrillation: Secondary | ICD-10-CM

## 2018-04-01 DIAGNOSIS — Z8679 Personal history of other diseases of the circulatory system: ICD-10-CM

## 2018-04-01 DIAGNOSIS — N390 Urinary tract infection, site not specified: ICD-10-CM

## 2018-04-01 DIAGNOSIS — N401 Enlarged prostate with lower urinary tract symptoms: ICD-10-CM

## 2018-04-01 DIAGNOSIS — G4730 Sleep apnea, unspecified: ICD-10-CM

## 2018-04-01 DIAGNOSIS — T50905A Adverse effect of unspecified drugs, medicaments and biological substances, initial encounter: ICD-10-CM

## 2018-04-01 DIAGNOSIS — I219 Acute myocardial infarction, unspecified: ICD-10-CM

## 2018-04-01 DIAGNOSIS — I10 Essential (primary) hypertension: ICD-10-CM

## 2018-04-01 DIAGNOSIS — I340 Nonrheumatic mitral (valve) insufficiency: ICD-10-CM

## 2018-04-01 DIAGNOSIS — K219 Gastro-esophageal reflux disease without esophagitis: ICD-10-CM

## 2018-04-01 DIAGNOSIS — I428 Other cardiomyopathies: ICD-10-CM

## 2018-04-01 DIAGNOSIS — R32 Unspecified urinary incontinence: ICD-10-CM

## 2018-04-01 DIAGNOSIS — Z789 Other specified health status: ICD-10-CM

## 2018-04-01 DIAGNOSIS — Z9889 Other specified postprocedural states: ICD-10-CM

## 2018-04-01 DIAGNOSIS — Z9229 Personal history of other drug therapy: ICD-10-CM

## 2018-04-01 MED ORDER — METOPROLOL SUCCINATE 100 MG PO TB24
50 mg | ORAL_TABLET | Freq: Every day | ORAL | 0 refills | 90.00000 days | Status: AC
Start: 2018-04-01 — End: 2018-04-08

## 2018-04-01 MED ORDER — SACUBITRIL-VALSARTAN 49-51 MG PO TAB
1 | ORAL_TABLET | Freq: Two times a day (BID) | ORAL | 11 refills | Status: AC
Start: 2018-04-01 — End: ?

## 2018-04-02 ENCOUNTER — Ambulatory Visit: Admit: 2018-04-01 | Discharge: 2018-04-02 | Payer: MEDICARE

## 2018-04-02 DIAGNOSIS — I2510 Atherosclerotic heart disease of native coronary artery without angina pectoris: Principal | ICD-10-CM

## 2018-04-02 DIAGNOSIS — E785 Hyperlipidemia, unspecified: ICD-10-CM

## 2018-04-02 DIAGNOSIS — Z8679 Personal history of other diseases of the circulatory system: ICD-10-CM

## 2018-04-02 DIAGNOSIS — Z952 Presence of prosthetic heart valve: ICD-10-CM

## 2018-04-02 DIAGNOSIS — I428 Other cardiomyopathies: ICD-10-CM

## 2018-04-02 DIAGNOSIS — G4733 Obstructive sleep apnea (adult) (pediatric): Secondary | ICD-10-CM

## 2018-04-02 DIAGNOSIS — I10 Essential (primary) hypertension: ICD-10-CM

## 2018-04-02 DIAGNOSIS — Z95810 Presence of automatic (implantable) cardiac defibrillator: ICD-10-CM

## 2018-04-02 DIAGNOSIS — N3281 Overactive bladder: ICD-10-CM

## 2018-04-08 ENCOUNTER — Ambulatory Visit: Admit: 2018-04-08 | Discharge: 2018-04-09 | Payer: MEDICARE

## 2018-04-08 MED ORDER — METOPROLOL SUCCINATE 50 MG PO TB24
25 mg | ORAL_TABLET | Freq: Every day | ORAL | 3 refills | 90.00000 days | Status: AC
Start: 2018-04-08 — End: 2018-04-09

## 2018-04-08 NOTE — Progress Notes
Pt came in for BP check, BP automated left arm 78/53 HR 61, BP manual left arm 82/60, BP manual right arm 78/53. Pt stated his shortness of breath and fatigue remains about the same. He has been taking the correct dose of Entresto 49/51 as well as all other cardiac medications. BPs at home range anywhere from 80s-low 100s/50s-90s. Pt will bring BP cuff next week to compare. Reviewed with RKB in clinic, he recommends to decrease metoprolol dose to 25 mg once a day. Pt and wife are agreeable to plan, they will continue to monitor BP at home and call with questions/concerns.

## 2018-04-09 ENCOUNTER — Encounter: Admit: 2018-04-09 | Discharge: 2018-04-09 | Payer: MEDICARE

## 2018-04-09 MED ORDER — METOPROLOL SUCCINATE 25 MG PO TB24
25 mg | ORAL_TABLET | Freq: Every day | ORAL | 3 refills | 90.00000 days | Status: AC
Start: 2018-04-09 — End: 2019-04-05

## 2018-04-15 ENCOUNTER — Ambulatory Visit: Admit: 2018-04-15 | Discharge: 2018-04-16 | Payer: MEDICARE

## 2018-04-15 NOTE — Progress Notes
Pt came in BP check, brought in BP cuff to compare. His machine was 83/51 (left arm) with HR of 80, 92/63 (right arm). Manual in office reading 98/60 (left arm). Pt's weight has been stable at 196. His shortness of breath remains the same and feels somewhat limited with his activities but feels it's better overall since staring Entresto. He is concerned about his memory, he's feel his short term memory is going and wanted to know if it's related to his diagnosis. Instructed pt we would like DRB know. Pt will continue to monitor his BPs and call with questions/concerns.

## 2018-05-28 ENCOUNTER — Encounter: Admit: 2018-05-28 | Discharge: 2018-05-28 | Payer: MEDICARE

## 2018-05-28 DIAGNOSIS — Z7901 Long term (current) use of anticoagulants: ICD-10-CM

## 2018-05-28 DIAGNOSIS — E785 Hyperlipidemia, unspecified: Principal | ICD-10-CM

## 2018-05-28 DIAGNOSIS — I428 Other cardiomyopathies: ICD-10-CM

## 2018-05-28 DIAGNOSIS — I4821 Permanent atrial fibrillation: ICD-10-CM

## 2018-05-28 NOTE — Telephone Encounter
Pt asked if he needed labs before his MD appointment next week.  Pt updated that no labs are ordered at this time.  Pt informed that orders could be sent to a lab of choice if needed.      Reviewed plan with the patient. Patient verbalized understanding and does not have any further questions or concerns. No further education requested from patient. Patient has our contact information for future needs.

## 2018-05-29 ENCOUNTER — Encounter: Admit: 2018-05-29 | Discharge: 2018-05-29 | Payer: MEDICARE

## 2018-05-29 LAB — LIPID PROFILE
Lab: 148 mg/dL — ABNORMAL HIGH (ref <100)
Lab: 198 mg/dL — ABNORMAL HIGH (ref <150)
Lab: 28 mg/dL — ABNORMAL LOW (ref >40)
Lab: 40 mL/min — ABNORMAL LOW (ref >60)

## 2018-05-29 LAB — CBC
Lab: 12
Lab: 5.5 MMOL/L (ref 21–30)

## 2018-05-29 LAB — PROTIME INR (PT): Lab: 3.3

## 2018-05-29 NOTE — Progress Notes
[facility]  Called pt with result and released to mychart.

## 2018-06-01 ENCOUNTER — Encounter: Admit: 2018-06-01 | Discharge: 2018-06-01 | Payer: MEDICARE

## 2018-06-01 ENCOUNTER — Ambulatory Visit: Admit: 2018-06-01 | Discharge: 2018-06-02 | Payer: MEDICARE

## 2018-06-01 DIAGNOSIS — N390 Urinary tract infection, site not specified: ICD-10-CM

## 2018-06-01 DIAGNOSIS — I428 Other cardiomyopathies: ICD-10-CM

## 2018-06-01 DIAGNOSIS — I480 Paroxysmal atrial fibrillation: ICD-10-CM

## 2018-06-01 DIAGNOSIS — E785 Hyperlipidemia, unspecified: Principal | ICD-10-CM

## 2018-06-01 DIAGNOSIS — E039 Hypothyroidism, unspecified: ICD-10-CM

## 2018-06-01 DIAGNOSIS — M1990 Unspecified osteoarthritis, unspecified site: ICD-10-CM

## 2018-06-01 DIAGNOSIS — Z95810 Presence of automatic (implantable) cardiac defibrillator: Secondary | ICD-10-CM

## 2018-06-01 DIAGNOSIS — N529 Male erectile dysfunction, unspecified: ICD-10-CM

## 2018-06-01 DIAGNOSIS — Z9889 Other specified postprocedural states: ICD-10-CM

## 2018-06-01 DIAGNOSIS — I951 Orthostatic hypotension: Secondary | ICD-10-CM

## 2018-06-01 DIAGNOSIS — E162 Hypoglycemia, unspecified: ICD-10-CM

## 2018-06-01 DIAGNOSIS — R32 Unspecified urinary incontinence: ICD-10-CM

## 2018-06-01 DIAGNOSIS — I4821 Permanent atrial fibrillation: ICD-10-CM

## 2018-06-01 DIAGNOSIS — I340 Nonrheumatic mitral (valve) insufficiency: ICD-10-CM

## 2018-06-01 DIAGNOSIS — K219 Gastro-esophageal reflux disease without esophagitis: ICD-10-CM

## 2018-06-01 DIAGNOSIS — Z9229 Personal history of other drug therapy: ICD-10-CM

## 2018-06-01 DIAGNOSIS — Z789 Other specified health status: ICD-10-CM

## 2018-06-01 DIAGNOSIS — R040 Epistaxis: ICD-10-CM

## 2018-06-01 DIAGNOSIS — K469 Unspecified abdominal hernia without obstruction or gangrene: ICD-10-CM

## 2018-06-01 DIAGNOSIS — I219 Acute myocardial infarction, unspecified: ICD-10-CM

## 2018-06-01 DIAGNOSIS — T50905A Adverse effect of unspecified drugs, medicaments and biological substances, initial encounter: ICD-10-CM

## 2018-06-01 DIAGNOSIS — N401 Enlarged prostate with lower urinary tract symptoms: ICD-10-CM

## 2018-06-01 DIAGNOSIS — Z8679 Personal history of other diseases of the circulatory system: ICD-10-CM

## 2018-06-01 DIAGNOSIS — N400 Enlarged prostate without lower urinary tract symptoms: ICD-10-CM

## 2018-06-01 DIAGNOSIS — K635 Polyp of colon: ICD-10-CM

## 2018-06-01 DIAGNOSIS — I10 Essential (primary) hypertension: ICD-10-CM

## 2018-06-01 DIAGNOSIS — Z7901 Long term (current) use of anticoagulants: ICD-10-CM

## 2018-06-01 DIAGNOSIS — G4730 Sleep apnea, unspecified: ICD-10-CM

## 2018-06-01 MED ORDER — TORSEMIDE 10 MG PO TAB
10 mg | ORAL_TABLET | Freq: Every day | ORAL | 3 refills | 67.50000 days | Status: AC
Start: 2018-06-01 — End: ?

## 2018-06-02 DIAGNOSIS — Z8679 Personal history of other diseases of the circulatory system: ICD-10-CM

## 2018-06-02 DIAGNOSIS — R42 Dizziness and giddiness: ICD-10-CM

## 2018-06-02 DIAGNOSIS — I493 Ventricular premature depolarization: Secondary | ICD-10-CM

## 2018-06-02 DIAGNOSIS — Z952 Presence of prosthetic heart valve: ICD-10-CM

## 2018-06-02 DIAGNOSIS — E785 Hyperlipidemia, unspecified: ICD-10-CM

## 2018-06-03 ENCOUNTER — Encounter: Admit: 2018-06-03 | Discharge: 2018-06-03 | Payer: MEDICARE

## 2018-06-03 ENCOUNTER — Ambulatory Visit: Admit: 2018-06-03 | Discharge: 2018-06-03 | Payer: MEDICARE

## 2018-06-03 DIAGNOSIS — I428 Other cardiomyopathies: Principal | ICD-10-CM

## 2018-06-03 DIAGNOSIS — Z95810 Presence of automatic (implantable) cardiac defibrillator: Secondary | ICD-10-CM

## 2018-06-03 DIAGNOSIS — I4819 Other persistent atrial fibrillation: ICD-10-CM

## 2018-06-03 DIAGNOSIS — E875 Hyperkalemia: ICD-10-CM

## 2018-06-03 DIAGNOSIS — I10 Essential (primary) hypertension: Principal | ICD-10-CM

## 2018-06-03 NOTE — Telephone Encounter
Called and discussed Dr. Mellody Life recommendations with patient.  He is agreeable to plan.  Scheduled pt for tele-health visit with Dr. Doristine Counter on 5/13.  Pt confirmed appointment time. Mailed BMP order to pt's home address.      Medication list updated.

## 2018-06-08 ENCOUNTER — Encounter: Admit: 2018-06-08 | Discharge: 2018-06-08 | Payer: MEDICARE

## 2018-06-08 DIAGNOSIS — I4821 Permanent atrial fibrillation: Principal | ICD-10-CM

## 2018-06-08 LAB — BASIC METABOLIC PANEL
Lab: 1.3 — ABNORMAL HIGH (ref 0.72–1.25)
Lab: 106
Lab: 108 — ABNORMAL HIGH (ref 70–105)
Lab: 138
Lab: 14
Lab: 20
Lab: 24
Lab: 5.5 — ABNORMAL HIGH (ref 3.5–5.1)
Lab: 55 — ABNORMAL LOW (ref >59)
Lab: 9.6

## 2018-06-09 ENCOUNTER — Encounter: Admit: 2018-06-09 | Discharge: 2018-06-09 | Payer: MEDICARE

## 2018-06-09 DIAGNOSIS — I10 Essential (primary) hypertension: Principal | ICD-10-CM

## 2018-06-09 DIAGNOSIS — E875 Hyperkalemia: ICD-10-CM

## 2018-06-10 ENCOUNTER — Encounter: Admit: 2018-06-10 | Discharge: 2018-06-10 | Payer: MEDICARE

## 2018-06-10 LAB — PROTIME INR (PT): Lab: 4.3

## 2018-06-11 ENCOUNTER — Encounter: Admit: 2018-06-11 | Discharge: 2018-06-11 | Payer: MEDICARE

## 2018-06-11 NOTE — Telephone Encounter
Spoke with [name] and his wife. He will be connecting with an Ipad. He can get into Mychart and we walked through the process to begin visit. He knows to click begin visit 30 minutes prior to the appointment. He has the ability to have his blood pressure, HR, and weight. We downloaded the Zoom app on his I pad. We practiced this a few times. He has another appt on May 27 that I will have cancelled. He will have the telehealth visit on Jun 17, 2018.     Joseph Hoover

## 2018-06-17 ENCOUNTER — Encounter: Admit: 2018-06-17 | Discharge: 2018-06-17 | Payer: MEDICARE

## 2018-06-17 DIAGNOSIS — E162 Hypoglycemia, unspecified: ICD-10-CM

## 2018-06-17 DIAGNOSIS — K635 Polyp of colon: ICD-10-CM

## 2018-06-17 DIAGNOSIS — N529 Male erectile dysfunction, unspecified: ICD-10-CM

## 2018-06-17 DIAGNOSIS — E785 Hyperlipidemia, unspecified: Principal | ICD-10-CM

## 2018-06-17 DIAGNOSIS — N390 Urinary tract infection, site not specified: ICD-10-CM

## 2018-06-17 DIAGNOSIS — I340 Nonrheumatic mitral (valve) insufficiency: ICD-10-CM

## 2018-06-17 DIAGNOSIS — Z9889 Other specified postprocedural states: ICD-10-CM

## 2018-06-17 DIAGNOSIS — N401 Enlarged prostate with lower urinary tract symptoms: ICD-10-CM

## 2018-06-17 DIAGNOSIS — E039 Hypothyroidism, unspecified: ICD-10-CM

## 2018-06-17 DIAGNOSIS — K219 Gastro-esophageal reflux disease without esophagitis: ICD-10-CM

## 2018-06-17 DIAGNOSIS — Z8679 Personal history of other diseases of the circulatory system: ICD-10-CM

## 2018-06-17 DIAGNOSIS — R32 Unspecified urinary incontinence: ICD-10-CM

## 2018-06-17 DIAGNOSIS — Z9229 Personal history of other drug therapy: ICD-10-CM

## 2018-06-17 DIAGNOSIS — I428 Other cardiomyopathies: ICD-10-CM

## 2018-06-17 DIAGNOSIS — N400 Enlarged prostate without lower urinary tract symptoms: ICD-10-CM

## 2018-06-17 DIAGNOSIS — R040 Epistaxis: ICD-10-CM

## 2018-06-17 DIAGNOSIS — I4821 Permanent atrial fibrillation: Secondary | ICD-10-CM

## 2018-06-17 DIAGNOSIS — I10 Essential (primary) hypertension: ICD-10-CM

## 2018-06-17 DIAGNOSIS — T50905A Adverse effect of unspecified drugs, medicaments and biological substances, initial encounter: ICD-10-CM

## 2018-06-17 DIAGNOSIS — I480 Paroxysmal atrial fibrillation: ICD-10-CM

## 2018-06-17 DIAGNOSIS — K469 Unspecified abdominal hernia without obstruction or gangrene: ICD-10-CM

## 2018-06-17 DIAGNOSIS — G4730 Sleep apnea, unspecified: ICD-10-CM

## 2018-06-17 DIAGNOSIS — I2510 Atherosclerotic heart disease of native coronary artery without angina pectoris: Secondary | ICD-10-CM

## 2018-06-17 DIAGNOSIS — M1990 Unspecified osteoarthritis, unspecified site: ICD-10-CM

## 2018-06-17 DIAGNOSIS — Z95810 Presence of automatic (implantable) cardiac defibrillator: ICD-10-CM

## 2018-06-17 DIAGNOSIS — Z789 Other specified health status: ICD-10-CM

## 2018-06-17 DIAGNOSIS — I219 Acute myocardial infarction, unspecified: ICD-10-CM

## 2018-06-17 NOTE — Patient Instructions
Dr. Doristine Counter would like you to have a device check when he is at our [place] office on 07/01/2018. Our [place] office will be in touch with you about an appointment time.    He will also plan to see you in 3 months at our [place] office.

## 2018-06-18 ENCOUNTER — Ambulatory Visit: Admit: 2018-06-17 | Discharge: 2018-06-18 | Payer: MEDICARE

## 2018-06-18 DIAGNOSIS — I5022 Chronic systolic (congestive) heart failure: Principal | ICD-10-CM

## 2018-06-18 DIAGNOSIS — E785 Hyperlipidemia, unspecified: Secondary | ICD-10-CM

## 2018-06-18 DIAGNOSIS — I420 Dilated cardiomyopathy: Secondary | ICD-10-CM

## 2018-06-18 DIAGNOSIS — I10 Essential (primary) hypertension: ICD-10-CM

## 2018-06-18 DIAGNOSIS — Z952 Presence of prosthetic heart valve: ICD-10-CM

## 2018-06-18 DIAGNOSIS — Z95810 Presence of automatic (implantable) cardiac defibrillator: ICD-10-CM

## 2018-06-24 ENCOUNTER — Encounter: Admit: 2018-06-24 | Discharge: 2018-06-24 | Payer: MEDICARE

## 2018-06-26 ENCOUNTER — Encounter: Admit: 2018-06-26 | Discharge: 2018-06-26 | Payer: MEDICARE

## 2018-06-26 ENCOUNTER — Ambulatory Visit: Admit: 2018-06-26 | Discharge: 2018-06-26 | Payer: MEDICARE

## 2018-06-26 DIAGNOSIS — R32 Unspecified urinary incontinence: ICD-10-CM

## 2018-06-26 DIAGNOSIS — I219 Acute myocardial infarction, unspecified: ICD-10-CM

## 2018-06-26 DIAGNOSIS — I10 Essential (primary) hypertension: ICD-10-CM

## 2018-06-26 DIAGNOSIS — E039 Hypothyroidism, unspecified: ICD-10-CM

## 2018-06-26 DIAGNOSIS — I428 Other cardiomyopathies: ICD-10-CM

## 2018-06-26 DIAGNOSIS — K219 Gastro-esophageal reflux disease without esophagitis: ICD-10-CM

## 2018-06-26 DIAGNOSIS — Z8679 Personal history of other diseases of the circulatory system: ICD-10-CM

## 2018-06-26 DIAGNOSIS — N390 Urinary tract infection, site not specified: ICD-10-CM

## 2018-06-26 DIAGNOSIS — N529 Male erectile dysfunction, unspecified: ICD-10-CM

## 2018-06-26 DIAGNOSIS — N401 Enlarged prostate with lower urinary tract symptoms: ICD-10-CM

## 2018-06-26 DIAGNOSIS — Z789 Other specified health status: ICD-10-CM

## 2018-06-26 DIAGNOSIS — T50905A Adverse effect of unspecified drugs, medicaments and biological substances, initial encounter: ICD-10-CM

## 2018-06-26 DIAGNOSIS — I4821 Permanent atrial fibrillation: Secondary | ICD-10-CM

## 2018-06-26 DIAGNOSIS — N400 Enlarged prostate without lower urinary tract symptoms: ICD-10-CM

## 2018-06-26 DIAGNOSIS — Z952 Presence of prosthetic heart valve: Secondary | ICD-10-CM

## 2018-06-26 DIAGNOSIS — Z9889 Other specified postprocedural states: ICD-10-CM

## 2018-06-26 DIAGNOSIS — M1990 Unspecified osteoarthritis, unspecified site: ICD-10-CM

## 2018-06-26 DIAGNOSIS — K469 Unspecified abdominal hernia without obstruction or gangrene: ICD-10-CM

## 2018-06-26 DIAGNOSIS — Z9229 Personal history of other drug therapy: ICD-10-CM

## 2018-06-26 DIAGNOSIS — R040 Epistaxis: ICD-10-CM

## 2018-06-26 DIAGNOSIS — Z95810 Presence of automatic (implantable) cardiac defibrillator: ICD-10-CM

## 2018-06-26 DIAGNOSIS — E162 Hypoglycemia, unspecified: ICD-10-CM

## 2018-06-26 DIAGNOSIS — G4730 Sleep apnea, unspecified: ICD-10-CM

## 2018-06-26 DIAGNOSIS — E785 Hyperlipidemia, unspecified: Principal | ICD-10-CM

## 2018-06-26 DIAGNOSIS — I340 Nonrheumatic mitral (valve) insufficiency: ICD-10-CM

## 2018-06-26 DIAGNOSIS — K635 Polyp of colon: ICD-10-CM

## 2018-06-26 DIAGNOSIS — I951 Orthostatic hypotension: ICD-10-CM

## 2018-06-26 DIAGNOSIS — I480 Paroxysmal atrial fibrillation: ICD-10-CM

## 2018-06-26 MED ORDER — DIGOXIN 250 MCG (0.25 MG) PO TAB
125 ug | ORAL_TABLET | Freq: Every day | ORAL | 3 refills | 30.00000 days | Status: DC
Start: 2018-06-26 — End: 2019-08-11

## 2018-06-30 ENCOUNTER — Encounter: Admit: 2018-06-30 | Discharge: 2018-06-30 | Payer: MEDICARE

## 2018-06-30 DIAGNOSIS — I428 Other cardiomyopathies: Principal | ICD-10-CM

## 2018-06-30 DIAGNOSIS — Z95810 Presence of automatic (implantable) cardiac defibrillator: ICD-10-CM

## 2018-06-30 DIAGNOSIS — I4821 Permanent atrial fibrillation: ICD-10-CM

## 2018-06-30 DIAGNOSIS — I10 Essential (primary) hypertension: ICD-10-CM

## 2018-06-30 LAB — BASIC METABOLIC PANEL
Lab: 1.2
Lab: 25
Lab: 99
Lab: 140
Lab: 19
Lab: 30
Lab: 9.6

## 2018-06-30 LAB — DIGOXIN LEVEL: Lab: 2 — ABNORMAL HIGH (ref 0.80–2.0)

## 2018-06-30 LAB — TSH WITH FREE T4 REFLEX: Lab: 2.6

## 2018-07-01 ENCOUNTER — Ambulatory Visit: Admit: 2018-07-01 | Discharge: 2018-07-02 | Payer: MEDICARE

## 2018-07-01 ENCOUNTER — Encounter: Admit: 2018-07-01 | Discharge: 2018-07-01 | Payer: MEDICARE

## 2018-07-01 DIAGNOSIS — I4821 Permanent atrial fibrillation: ICD-10-CM

## 2018-07-01 DIAGNOSIS — Z95810 Presence of automatic (implantable) cardiac defibrillator: ICD-10-CM

## 2018-07-01 DIAGNOSIS — I428 Other cardiomyopathies: Principal | ICD-10-CM

## 2018-07-01 DIAGNOSIS — I10 Essential (primary) hypertension: ICD-10-CM

## 2018-07-01 MED ORDER — PERFLUTREN LIPID MICROSPHERES 1.1 MG/ML IV SUSP
1-20 mL | Freq: Once | INTRAVENOUS | 0 refills | Status: CP | PRN
Start: 2018-07-01 — End: ?

## 2018-07-02 ENCOUNTER — Encounter: Admit: 2018-07-02 | Discharge: 2018-07-02 | Payer: MEDICARE

## 2018-07-02 ENCOUNTER — Ambulatory Visit: Admit: 2018-07-02 | Discharge: 2018-07-02 | Payer: MEDICARE

## 2018-07-02 DIAGNOSIS — I428 Other cardiomyopathies: ICD-10-CM

## 2018-07-02 DIAGNOSIS — I420 Dilated cardiomyopathy: Principal | ICD-10-CM

## 2018-07-02 DIAGNOSIS — Z95810 Presence of automatic (implantable) cardiac defibrillator: ICD-10-CM

## 2018-07-02 DIAGNOSIS — I10 Essential (primary) hypertension: ICD-10-CM

## 2018-07-02 DIAGNOSIS — I472 Ventricular tachycardia: ICD-10-CM

## 2018-07-02 DIAGNOSIS — I4821 Permanent atrial fibrillation: ICD-10-CM

## 2018-07-02 DIAGNOSIS — Z952 Presence of prosthetic heart valve: ICD-10-CM

## 2018-07-02 DIAGNOSIS — I2510 Atherosclerotic heart disease of native coronary artery without angina pectoris: ICD-10-CM

## 2018-07-02 NOTE — Progress Notes
Holter Placement Record  Ordering Physician: Dendi  Diagnosis: PVC'S - Nonischemic cardiomyopathy  Brand:  Cardionet   Length: 24 hours  Holter Number: [id number]  Card Number: [id number]  Holter start: 3:07  Location where Holter was placed: [facility]  Will Holter be returned by mail? No

## 2018-07-08 ENCOUNTER — Encounter: Admit: 2018-07-08 | Discharge: 2018-07-08

## 2018-07-08 DIAGNOSIS — Z7901 Long term (current) use of anticoagulants: Secondary | ICD-10-CM

## 2018-07-08 DIAGNOSIS — Z952 Presence of prosthetic heart valve: Secondary | ICD-10-CM

## 2018-07-08 DIAGNOSIS — I4821 Permanent atrial fibrillation: Secondary | ICD-10-CM

## 2018-07-08 NOTE — Telephone Encounter
Spoke to pt re: overdue IN[facility] to see if [facility] re: results that were to have been drawn last week, but pt states he has not had INR drawn as he is waiting on MD order to be sent to [facility]. Advised pt we would fax order today ([facility] admitting lab fax: [phone]) and pt states he will go this afternoon. Advised pt to call first to ensure order was received. Pt verbalized understanding and was agreeable to this plan. No further needs identified at this time.    ----- Message -----  From: Rogelia Boga, RN  Sent: 06/16/2018  To: Bridget Hartshorn Nurse [facility]  Subject: INR Due 06/16/18                                  Florian Buff is due for an INR test on 06/16/18.

## 2018-07-08 NOTE — Telephone Encounter
-----   Message -----  From: Alfredia Client, LPN  Sent: 09/04/8297  10:50 AM CDT  To: Caryl Pina, RN  Subject: RE: fax lab order please                         This is done    ----- Message -----  From: Caryl Pina, RN  Sent: 07/08/2018   8:39 AM CDT  To: Alfredia Client, LPN  Subject: fax lab order please                             Please fax INR lab order to [facility] admitting lab fax: [phone] so pt can have INR this afternoon. Thank you!

## 2018-07-09 ENCOUNTER — Encounter: Admit: 2018-07-09 | Discharge: 2018-07-09

## 2018-07-10 ENCOUNTER — Encounter: Admit: 2018-07-10 | Discharge: 2018-07-10

## 2018-07-10 NOTE — Telephone Encounter
Called pt to assess current condition.  Pt reports he is feeling better with improved DOE but states "I'm not where I want to be, but its better".  Pt states weight has been stable at 196-197lbs.  Pt reports he has been trying to increase his fluid intake to stay hydrated.      Pt reports he has had constipation over the past several days.  Pt reports a small BM last night and a small BM today.  Pt reports he has been taking his Docusate without fail.  Pt advised to increase his fiber intake and provided with a list of foods.  Pt reported to monitor this closely and to contact PCP if this persists.      Reviewed plan with the patient. Patient verbalized understanding and does not have any further questions or concerns. No further education requested from patient. Patient has our contact information for future needs.

## 2018-07-10 NOTE — Telephone Encounter
-----   Message from Theda Sers, MD sent at 07/08/2018 10:05 PM CDT -----  His digoxin level was high and therefore I am glad that we had decreased his dose.  He is rest of the lab work looks good.  I am hoping that he is feeling better since the decrease in the dose of the digoxin.  Please check with the patient.  If needed, we can increase the diuretic dose if he is still having weight gain or shortness of breath.  ----- Message -----  From: Idelle Crouch, RN  Sent: 06/30/2018   5:44 PM CDT  To: Theda Sers, MD    Labs from 5/22 OV, for Digoxin decrease from 0.25 to 0.125mg  and for his previous K of 5.5 and Crea of 1.3.  Recommendations?

## 2018-07-10 NOTE — Progress Notes
Mechanical valve looks good.  LV function is still weak but no change.  No significant changes from the previous study in June 2019.    We are still waiting for the monitor results.

## 2018-07-22 ENCOUNTER — Encounter: Admit: 2018-07-22 | Discharge: 2018-07-22

## 2018-07-22 NOTE — Progress Notes
07/22/2018 1:36 PM   Mr Joseph Hoover is again going to [place] for his INR so they are now back open and will resume his management of his Warfarin. Gaynelle Cage, RN

## 2018-09-02 ENCOUNTER — Ambulatory Visit: Admit: 2018-09-02 | Discharge: 2018-09-03

## 2018-09-02 ENCOUNTER — Encounter: Admit: 2018-09-02 | Discharge: 2018-09-02

## 2018-09-02 DIAGNOSIS — I472 Ventricular tachycardia: Secondary | ICD-10-CM

## 2018-09-02 DIAGNOSIS — I428 Other cardiomyopathies: Principal | ICD-10-CM

## 2018-09-02 DIAGNOSIS — I4819 Other persistent atrial fibrillation: Secondary | ICD-10-CM

## 2018-09-02 DIAGNOSIS — Z95810 Presence of automatic (implantable) cardiac defibrillator: Secondary | ICD-10-CM

## 2018-09-11 ENCOUNTER — Encounter: Admit: 2018-09-11 | Discharge: 2018-09-11

## 2018-09-11 ENCOUNTER — Ambulatory Visit: Admit: 2018-09-11 | Discharge: 2018-09-12

## 2018-09-11 DIAGNOSIS — I219 Acute myocardial infarction, unspecified: Secondary | ICD-10-CM

## 2018-09-11 DIAGNOSIS — K635 Polyp of colon: Secondary | ICD-10-CM

## 2018-09-11 DIAGNOSIS — I2510 Atherosclerotic heart disease of native coronary artery without angina pectoris: Principal | ICD-10-CM

## 2018-09-11 DIAGNOSIS — M1990 Unspecified osteoarthritis, unspecified site: Secondary | ICD-10-CM

## 2018-09-11 DIAGNOSIS — K219 Gastro-esophageal reflux disease without esophagitis: Secondary | ICD-10-CM

## 2018-09-11 DIAGNOSIS — Z8679 Personal history of other diseases of the circulatory system: Secondary | ICD-10-CM

## 2018-09-11 DIAGNOSIS — N529 Male erectile dysfunction, unspecified: Secondary | ICD-10-CM

## 2018-09-11 DIAGNOSIS — Z95810 Presence of automatic (implantable) cardiac defibrillator: Secondary | ICD-10-CM

## 2018-09-11 DIAGNOSIS — Z9889 Other specified postprocedural states: Secondary | ICD-10-CM

## 2018-09-11 DIAGNOSIS — T50905A Adverse effect of unspecified drugs, medicaments and biological substances, initial encounter: Secondary | ICD-10-CM

## 2018-09-11 DIAGNOSIS — N400 Enlarged prostate without lower urinary tract symptoms: Secondary | ICD-10-CM

## 2018-09-11 DIAGNOSIS — E785 Hyperlipidemia, unspecified: Secondary | ICD-10-CM

## 2018-09-11 DIAGNOSIS — Z9229 Personal history of other drug therapy: Secondary | ICD-10-CM

## 2018-09-11 DIAGNOSIS — N401 Enlarged prostate with lower urinary tract symptoms: Secondary | ICD-10-CM

## 2018-09-11 DIAGNOSIS — I340 Nonrheumatic mitral (valve) insufficiency: Secondary | ICD-10-CM

## 2018-09-11 DIAGNOSIS — I428 Other cardiomyopathies: Secondary | ICD-10-CM

## 2018-09-11 DIAGNOSIS — I10 Essential (primary) hypertension: Secondary | ICD-10-CM

## 2018-09-11 DIAGNOSIS — I480 Paroxysmal atrial fibrillation: Secondary | ICD-10-CM

## 2018-09-11 DIAGNOSIS — G4733 Obstructive sleep apnea (adult) (pediatric): Secondary | ICD-10-CM

## 2018-09-11 DIAGNOSIS — Z789 Other specified health status: Secondary | ICD-10-CM

## 2018-09-11 DIAGNOSIS — E039 Hypothyroidism, unspecified: Secondary | ICD-10-CM

## 2018-09-11 DIAGNOSIS — E162 Hypoglycemia, unspecified: Secondary | ICD-10-CM

## 2018-09-11 DIAGNOSIS — K469 Unspecified abdominal hernia without obstruction or gangrene: Secondary | ICD-10-CM

## 2018-09-11 DIAGNOSIS — R040 Epistaxis: Secondary | ICD-10-CM

## 2018-09-11 DIAGNOSIS — N390 Urinary tract infection, site not specified: Secondary | ICD-10-CM

## 2018-09-11 DIAGNOSIS — G4730 Sleep apnea, unspecified: Secondary | ICD-10-CM

## 2018-09-11 DIAGNOSIS — R32 Unspecified urinary incontinence: Secondary | ICD-10-CM

## 2018-09-12 DIAGNOSIS — Z952 Presence of prosthetic heart valve: Secondary | ICD-10-CM

## 2018-09-12 DIAGNOSIS — I4821 Permanent atrial fibrillation: Secondary | ICD-10-CM

## 2018-09-12 DIAGNOSIS — I340 Nonrheumatic mitral (valve) insufficiency: Secondary | ICD-10-CM

## 2018-09-12 DIAGNOSIS — I10 Essential (primary) hypertension: Secondary | ICD-10-CM

## 2018-11-24 ENCOUNTER — Encounter: Admit: 2018-11-24 | Discharge: 2018-11-24 | Payer: MEDICARE

## 2019-01-05 ENCOUNTER — Encounter: Admit: 2019-01-05 | Discharge: 2019-01-05 | Payer: MEDICARE

## 2019-01-05 DIAGNOSIS — Z95810 Presence of automatic (implantable) cardiac defibrillator: Secondary | ICD-10-CM

## 2019-01-05 DIAGNOSIS — I4819 Other persistent atrial fibrillation: Secondary | ICD-10-CM

## 2019-01-05 DIAGNOSIS — I428 Other cardiomyopathies: Secondary | ICD-10-CM

## 2019-02-16 ENCOUNTER — Encounter: Admit: 2019-02-16 | Discharge: 2019-02-16 | Payer: MEDICARE

## 2019-02-26 ENCOUNTER — Encounter: Admit: 2019-02-26 | Discharge: 2019-02-26 | Payer: MEDICARE

## 2019-02-26 DIAGNOSIS — I10 Essential (primary) hypertension: Secondary | ICD-10-CM

## 2019-02-26 DIAGNOSIS — T50905A Adverse effect of unspecified drugs, medicaments and biological substances, initial encounter: Secondary | ICD-10-CM

## 2019-02-26 DIAGNOSIS — N390 Urinary tract infection, site not specified: Secondary | ICD-10-CM

## 2019-02-26 DIAGNOSIS — G4730 Sleep apnea, unspecified: Secondary | ICD-10-CM

## 2019-02-26 DIAGNOSIS — Z9889 Other specified postprocedural states: Secondary | ICD-10-CM

## 2019-02-26 DIAGNOSIS — Z95810 Presence of automatic (implantable) cardiac defibrillator: Secondary | ICD-10-CM

## 2019-02-26 DIAGNOSIS — I340 Nonrheumatic mitral (valve) insufficiency: Secondary | ICD-10-CM

## 2019-02-26 DIAGNOSIS — N400 Enlarged prostate without lower urinary tract symptoms: Secondary | ICD-10-CM

## 2019-02-26 DIAGNOSIS — I480 Paroxysmal atrial fibrillation: Secondary | ICD-10-CM

## 2019-02-26 DIAGNOSIS — K635 Polyp of colon: Secondary | ICD-10-CM

## 2019-02-26 DIAGNOSIS — N401 Enlarged prostate with lower urinary tract symptoms: Secondary | ICD-10-CM

## 2019-02-26 DIAGNOSIS — I219 Acute myocardial infarction, unspecified: Secondary | ICD-10-CM

## 2019-02-26 DIAGNOSIS — R32 Unspecified urinary incontinence: Secondary | ICD-10-CM

## 2019-02-26 DIAGNOSIS — K469 Unspecified abdominal hernia without obstruction or gangrene: Secondary | ICD-10-CM

## 2019-02-26 DIAGNOSIS — Z789 Other specified health status: Secondary | ICD-10-CM

## 2019-02-26 DIAGNOSIS — I428 Other cardiomyopathies: Secondary | ICD-10-CM

## 2019-02-26 DIAGNOSIS — Z9229 Personal history of other drug therapy: Secondary | ICD-10-CM

## 2019-02-26 DIAGNOSIS — M1990 Unspecified osteoarthritis, unspecified site: Secondary | ICD-10-CM

## 2019-02-26 DIAGNOSIS — R040 Epistaxis: Secondary | ICD-10-CM

## 2019-02-26 DIAGNOSIS — E785 Hyperlipidemia, unspecified: Secondary | ICD-10-CM

## 2019-02-26 DIAGNOSIS — K219 Gastro-esophageal reflux disease without esophagitis: Secondary | ICD-10-CM

## 2019-02-26 DIAGNOSIS — E162 Hypoglycemia, unspecified: Secondary | ICD-10-CM

## 2019-02-26 DIAGNOSIS — E039 Hypothyroidism, unspecified: Secondary | ICD-10-CM

## 2019-02-26 DIAGNOSIS — Z8679 Personal history of other diseases of the circulatory system: Secondary | ICD-10-CM

## 2019-02-26 DIAGNOSIS — N529 Male erectile dysfunction, unspecified: Secondary | ICD-10-CM

## 2019-02-28 ENCOUNTER — Encounter: Admit: 2019-02-28 | Discharge: 2019-02-28 | Payer: MEDICARE

## 2019-02-28 DIAGNOSIS — R040 Epistaxis: Secondary | ICD-10-CM

## 2019-02-28 DIAGNOSIS — I340 Nonrheumatic mitral (valve) insufficiency: Secondary | ICD-10-CM

## 2019-02-28 DIAGNOSIS — Z8679 Personal history of other diseases of the circulatory system: Secondary | ICD-10-CM

## 2019-02-28 DIAGNOSIS — N390 Urinary tract infection, site not specified: Secondary | ICD-10-CM

## 2019-02-28 DIAGNOSIS — N400 Enlarged prostate without lower urinary tract symptoms: Secondary | ICD-10-CM

## 2019-02-28 DIAGNOSIS — G4730 Sleep apnea, unspecified: Secondary | ICD-10-CM

## 2019-02-28 DIAGNOSIS — T50905A Adverse effect of unspecified drugs, medicaments and biological substances, initial encounter: Secondary | ICD-10-CM

## 2019-02-28 DIAGNOSIS — E162 Hypoglycemia, unspecified: Secondary | ICD-10-CM

## 2019-02-28 DIAGNOSIS — K219 Gastro-esophageal reflux disease without esophagitis: Secondary | ICD-10-CM

## 2019-02-28 DIAGNOSIS — R32 Unspecified urinary incontinence: Secondary | ICD-10-CM

## 2019-02-28 DIAGNOSIS — E039 Hypothyroidism, unspecified: Secondary | ICD-10-CM

## 2019-02-28 DIAGNOSIS — Z789 Other specified health status: Secondary | ICD-10-CM

## 2019-02-28 DIAGNOSIS — E785 Hyperlipidemia, unspecified: Secondary | ICD-10-CM

## 2019-02-28 DIAGNOSIS — Z9229 Personal history of other drug therapy: Secondary | ICD-10-CM

## 2019-02-28 DIAGNOSIS — Z95810 Presence of automatic (implantable) cardiac defibrillator: Secondary | ICD-10-CM

## 2019-02-28 DIAGNOSIS — N401 Enlarged prostate with lower urinary tract symptoms: Secondary | ICD-10-CM

## 2019-02-28 DIAGNOSIS — N529 Male erectile dysfunction, unspecified: Secondary | ICD-10-CM

## 2019-02-28 DIAGNOSIS — K635 Polyp of colon: Secondary | ICD-10-CM

## 2019-02-28 DIAGNOSIS — I480 Paroxysmal atrial fibrillation: Secondary | ICD-10-CM

## 2019-02-28 DIAGNOSIS — I428 Other cardiomyopathies: Secondary | ICD-10-CM

## 2019-02-28 DIAGNOSIS — K469 Unspecified abdominal hernia without obstruction or gangrene: Secondary | ICD-10-CM

## 2019-02-28 DIAGNOSIS — I10 Essential (primary) hypertension: Secondary | ICD-10-CM

## 2019-02-28 DIAGNOSIS — Z9889 Other specified postprocedural states: Secondary | ICD-10-CM

## 2019-02-28 DIAGNOSIS — I219 Acute myocardial infarction, unspecified: Secondary | ICD-10-CM

## 2019-02-28 DIAGNOSIS — M1990 Unspecified osteoarthritis, unspecified site: Secondary | ICD-10-CM

## 2019-03-02 ENCOUNTER — Encounter: Admit: 2019-03-02 | Discharge: 2019-03-02 | Payer: MEDICARE

## 2019-03-02 NOTE — Progress Notes
Joseph Hoover, February 17, 1940 has an appointment with Dr. Doristine Counter on 03/09/19.    Please send recent lab results for continuity of care.    Thank you,   Kim Oki    Phone: [phone]  Fax: [phone]

## 2019-03-03 ENCOUNTER — Ambulatory Visit: Admit: 2019-03-03 | Discharge: 2019-03-03 | Payer: MEDICARE

## 2019-03-03 DIAGNOSIS — I428 Other cardiomyopathies: Secondary | ICD-10-CM

## 2019-03-03 DIAGNOSIS — Z95810 Presence of automatic (implantable) cardiac defibrillator: Secondary | ICD-10-CM

## 2019-03-03 DIAGNOSIS — I4819 Other persistent atrial fibrillation: Secondary | ICD-10-CM

## 2019-03-05 ENCOUNTER — Encounter: Admit: 2019-03-05 | Discharge: 2019-03-05 | Payer: MEDICARE

## 2019-03-05 NOTE — Telephone Encounter
Pt scheduled to see DRB on 2/2. He had appt with DRB on 1/22 in [place], [facility] recommended 6 month f/u. Spoke with pt, he was agreeable to canceling appt on 2/2 and will wait to schedule 6 month f/u when he knows his schedule.

## 2019-03-12 ENCOUNTER — Encounter: Admit: 2019-03-12 | Discharge: 2019-03-12 | Payer: MEDICARE

## 2019-04-03 ENCOUNTER — Encounter: Admit: 2019-04-03 | Discharge: 2019-04-03 | Payer: MEDICARE

## 2019-04-05 MED ORDER — METOPROLOL SUCCINATE 25 MG PO TB24
ORAL_TABLET | Freq: Every day | ORAL | 3 refills | 90.00000 days | Status: DC
Start: 2019-04-05 — End: 2019-08-11

## 2019-06-07 ENCOUNTER — Encounter: Admit: 2019-06-07 | Discharge: 2019-06-07 | Payer: MEDICARE

## 2019-06-07 ENCOUNTER — Ambulatory Visit: Admit: 2019-06-07 | Discharge: 2019-06-07 | Payer: MEDICARE

## 2019-06-07 DIAGNOSIS — E785 Hyperlipidemia, unspecified: Secondary | ICD-10-CM

## 2019-06-07 DIAGNOSIS — Z8679 Personal history of other diseases of the circulatory system: Secondary | ICD-10-CM

## 2019-06-07 DIAGNOSIS — Z95810 Presence of automatic (implantable) cardiac defibrillator: Secondary | ICD-10-CM

## 2019-06-14 ENCOUNTER — Encounter: Admit: 2019-06-14 | Discharge: 2019-06-14 | Payer: MEDICARE

## 2019-07-02 ENCOUNTER — Encounter: Admit: 2019-07-02 | Discharge: 2019-07-02 | Payer: MEDICARE

## 2019-07-02 NOTE — Progress Notes
Records Request    Medical records request for continuation of care:    Patient has appointment on 7.7.2021   with  Dr. Doristine Counter    Please fax records to Cardiovascular Medicine [place] [facility] [phone]    Request records:      Recent Labs      Thank you,      Cardiovascular Medicine  [facility]  [address]  [place], [place] [address]  Phone:  [phone]  Fax:  [phone]

## 2019-08-10 ENCOUNTER — Encounter: Admit: 2019-08-10 | Discharge: 2019-08-10 | Payer: MEDICARE

## 2019-08-11 ENCOUNTER — Encounter: Admit: 2019-08-11 | Discharge: 2019-08-11 | Payer: MEDICARE

## 2019-08-11 DIAGNOSIS — N401 Enlarged prostate with lower urinary tract symptoms: Secondary | ICD-10-CM

## 2019-08-11 DIAGNOSIS — E162 Hypoglycemia, unspecified: Secondary | ICD-10-CM

## 2019-08-11 DIAGNOSIS — Z8679 Personal history of other diseases of the circulatory system: Secondary | ICD-10-CM

## 2019-08-11 DIAGNOSIS — I2510 Atherosclerotic heart disease of native coronary artery without angina pectoris: Secondary | ICD-10-CM

## 2019-08-11 DIAGNOSIS — Z9229 Personal history of other drug therapy: Secondary | ICD-10-CM

## 2019-08-11 DIAGNOSIS — R32 Unspecified urinary incontinence: Secondary | ICD-10-CM

## 2019-08-11 DIAGNOSIS — E039 Hypothyroidism, unspecified: Secondary | ICD-10-CM

## 2019-08-11 DIAGNOSIS — I480 Paroxysmal atrial fibrillation: Secondary | ICD-10-CM

## 2019-08-11 DIAGNOSIS — R040 Epistaxis: Secondary | ICD-10-CM

## 2019-08-11 DIAGNOSIS — Z95810 Presence of automatic (implantable) cardiac defibrillator: Secondary | ICD-10-CM

## 2019-08-11 DIAGNOSIS — I428 Other cardiomyopathies: Secondary | ICD-10-CM

## 2019-08-11 DIAGNOSIS — K635 Polyp of colon: Secondary | ICD-10-CM

## 2019-08-11 DIAGNOSIS — I10 Essential (primary) hypertension: Secondary | ICD-10-CM

## 2019-08-11 DIAGNOSIS — Z9889 Other specified postprocedural states: Secondary | ICD-10-CM

## 2019-08-11 DIAGNOSIS — I340 Nonrheumatic mitral (valve) insufficiency: Secondary | ICD-10-CM

## 2019-08-11 DIAGNOSIS — G4730 Sleep apnea, unspecified: Secondary | ICD-10-CM

## 2019-08-11 DIAGNOSIS — T50905A Adverse effect of unspecified drugs, medicaments and biological substances, initial encounter: Secondary | ICD-10-CM

## 2019-08-11 DIAGNOSIS — E785 Hyperlipidemia, unspecified: Secondary | ICD-10-CM

## 2019-08-11 DIAGNOSIS — K219 Gastro-esophageal reflux disease without esophagitis: Secondary | ICD-10-CM

## 2019-08-11 DIAGNOSIS — I219 Acute myocardial infarction, unspecified: Secondary | ICD-10-CM

## 2019-08-11 DIAGNOSIS — Z789 Other specified health status: Secondary | ICD-10-CM

## 2019-08-11 DIAGNOSIS — Z952 Presence of prosthetic heart valve: Secondary | ICD-10-CM

## 2019-08-11 DIAGNOSIS — M1990 Unspecified osteoarthritis, unspecified site: Secondary | ICD-10-CM

## 2019-08-11 DIAGNOSIS — N529 Male erectile dysfunction, unspecified: Secondary | ICD-10-CM

## 2019-08-11 DIAGNOSIS — N390 Urinary tract infection, site not specified: Secondary | ICD-10-CM

## 2019-08-11 DIAGNOSIS — I4821 Permanent atrial fibrillation: Secondary | ICD-10-CM

## 2019-08-11 DIAGNOSIS — K469 Unspecified abdominal hernia without obstruction or gangrene: Secondary | ICD-10-CM

## 2019-08-11 DIAGNOSIS — D7582 Heparin induced thrombocytopenia (HIT): Secondary | ICD-10-CM

## 2019-08-11 DIAGNOSIS — G4733 Obstructive sleep apnea (adult) (pediatric): Secondary | ICD-10-CM

## 2019-08-11 DIAGNOSIS — Z7901 Long term (current) use of anticoagulants: Secondary | ICD-10-CM

## 2019-08-11 DIAGNOSIS — N400 Enlarged prostate without lower urinary tract symptoms: Secondary | ICD-10-CM

## 2019-08-11 MED ORDER — METOPROLOL SUCCINATE 25 MG PO TB24
25 mg | ORAL_TABLET | Freq: Every day | ORAL | 3 refills | 90.00000 days | Status: AC
Start: 2019-08-11 — End: ?

## 2019-08-11 MED ORDER — DIGOXIN 125 MCG (0.125 MG) PO TAB
125 ug | ORAL_TABLET | Freq: Every day | ORAL | 3 refills | 30.00000 days | Status: AC
Start: 2019-08-11 — End: ?

## 2019-08-11 MED ORDER — DIGOXIN 125 MCG (0.125 MG) PO TAB
125 ug | ORAL_TABLET | Freq: Every day | ORAL | 3 refills | 30.00000 days | Status: DC
Start: 2019-08-11 — End: 2019-08-11

## 2019-08-11 NOTE — Progress Notes
Date of Service: 08/11/2019    Joseph Hoover is a 79 y.o. male.       HPI     Joseph Hoover is a delightful 79 year old gentleman followed through the self this for permanent atrial fibrillation, dilated nonischemic cardiomyopathy, mechanical mitral valve replacement 1998, severe LAE, ICD with ?failed LV leads past and present, recurrent polymorphic VT and PVCs, nonobstructive CAD, and heart failure with reduced ejection fraction. ?An ICD implant in 2005 epicardial LV leads were attempted but failed an attempt and a CS lead failed and an LV lead failed due to diaphragmatic pacing. ?He is programmed at [facility] with his ICD currently. ?His atrial fibrillation failed treatment with amiodarone and dofetilide. ?Not a candidate for ablation due to left atrium over 6 cm.  He is not a good candidate for advanced heart failure therapies.      Mr. Joseph Hoover is back for follow-up of his coronary disease. He has been having difficulty. He gets lightheaded at times. His blood pressure today is 88/58. He says that he is not feeling lightheaded or tired now. His wife indicates that he has had times where he got weak and lightheaded. They had to go to the emergency room in [place] a couple months ago where he was given IV fluids after that he felt better for a few days. Laboratory at that time revealed normal electrolytes. BUN and creatinine were 26 and 1.33 which is typical for him. CBC was unremarkable. He has not had syncope or falls. We does not work in the yard he becomes lightheaded and has orthostatic symptoms but only when he has been working outside. He has been following his fluid restrictions.    Interrogation of his ICD last month demonstrated normal fullness of the device and leads. The battery is 7.6 years remaining. He had 19 short episodes of nonsustained ventricular tachycardia lasting up to 3 seconds. This is not new. He is unaware of his ventricular arrhythmias.    Examination today shows no JVD chest is clear heart is regular. Blood pressure is 80/58    Impression  1. Hypotension. I think we have room over diuresed. We will decrease his torsemide to 10 mg 3 times a week. Is going to come back in for blood pressure check in the office in a couple of weeks. If he is still hypotensive we will decrease Entresto to 24/26 milligrams twice daily. I would like to stay with his current dose of Toprol-XL 25 mg daily because of ventricular arrhythmias.  2. Heart failure with reduced ejection fraction. Currently on guideline directed medical therapy. Functional class II symptoms  3. Dilated nonischemic cardiomyopathy  4. ICD function  5. Permanent atrial fibrillation. Heart rate is controlled. At age 11 years with creatinine of 1.3 I think his digoxin dose of 0.25 mg daily is excessive. We are going to drop the dose down to 0.125 mg daily after checking a digoxin level  6. Status post mitral valve replacement with [facility] Jude mechanical valve  7. Sleep apnea  8. Ventricular tachycardia. Has several short episodes but no sustained VT. ICD is in place. We will continue to treat with beta-blockers alone.  9. Hypertension. I think he is a bit overtreated now. He back off on his diuretic first and will back off on Entresto next if blood pressure does not come up  10. Hypercholesterolemia. Not currently treated. He has a nonischemic cardiomyopathy and he is concerned about more drugs. I think that is correct.  Plan  Decrease torsemide to 10 mg Monday-Wednesday-Friday  Come back for blood pressure check in 2 weeks. If blood pressure is not above 105 systolic will decrease Entresto to 24/26 mg twice daily  Follow-up in the office in 3 months  Continue all other therapies      This note was dictated using Dragon instant transcription system.  Please pardon any inadvertent errors.       Vitals:    08/11/19 1246 08/11/19 1302   BP: (!) 80/58 (!) 86/62   BP Source: Arm, Right Upper Arm, Left Upper   Patient Position: Sitting Sitting Pulse: 60    SpO2: 98%    Weight: 88.9 kg (196 lb)    Height: 1.829 m (6')    PainSc: Zero      Body mass index is 26.58 kg/m?Marland Kitchen     Past Medical History  Patient Active Problem List    Diagnosis Date Noted   ? NICM (nonischemic cardiomyopathy) (HCC) 01/29/2011     Priority: Low     12/2003 cath in [place]: LAD 40% OMB 50% RCA normal  02/10-Echo with EF of 50% and a left atrial size of 5.7 cm    Stress test negative for any significant ischemia, EF 50%  09/13 echo: EF 40% LA 6.7, LV 6.7 mild LVH, mechanical mitral valve well-seated, mean gradient 4, moderate RAE, mild AI  10/13 regaden thallium: EF 37%, LV 156 mL  Small basal inferolateral perfusion defect suspect nonischemic cardiomyopathy  01/15 echo: EF 30-35%, LV 6.7, LA 6.2, RV normal, normal [facility] Jude bileaflet mitral valve, trace MR, mod TR, PAP 37  10/15 echo: EF 25%, normal mitral valve mechanical prosthesis, mean grad 3, no MR, no effusion, LV 5.6 LA 4.7, PAP 36  4/18 echo, TDS with PVCs.  EF 25%, LV 6.0, mild LVH, LA 5.9, RV normal, normal mechanical MV, mean grad 4, mild AI  6/19 echo EF 20%, LV 6.6, LA 5.7, RV enlarged with normal function, normal [facility] Jude mechanical valve,  mn grad 3, PA P 31     ? Permanent atrial fibrillation 09/01/2008     Priority: Low     a.  Initial presentation in [place] 10/05.  At that time, his EF was 35% (although all prior                    and subsequent EF at [facility] (through 2006) showed an EF of 50% or greater).  They                 placed a CRT ICD (he is hooked up to an epicardial LV lead).  They had planned to                    ablate his node but did not. His QRS was relatively narrow (per LDB: this strategy                    seems to make little sense).              b.  10/05=>Spring 2007 - No AF on amiodarone 200 mg daily. Amiodarone was held for          two  months because of a questionable rash (it turned out just to be dry skin). At the                     end of two months he had three 10 hour  episodes of AF. Amiodarone was reinstituted        with no additional AF over the next six months.                 c.  C.  Status post amiodarone discontinuation and Tikosyn initiation in January 2009.   D. Recurrent AF. D/C Tikosyn. Rate control. AVJ ablation in future if needed.       ? ICD (implantable cardioverter-defibrillator), single, in situ 08/30/2008     Priority: Low     11/2003 [place].  Low EF with atrial fibrillation.  CRT-D placed with Guidant Contak Hg 179, Medtronic 446 A-lead, Guidant 0185 RV lead a Medtronic 4402 transvenous lead.  Leads failed.  Epicardial lead placed  01/06 Three  ICD shocks for atrial fibrillation.  Amiodarone  Started  12/07  Medtronic [id number] dual-chamber ICD programmed to AAIR/DDDR mode.  Both LV leads failed-capped EF improved  01/15 generator change.  New Medtronic [place] VR [id number] sn [id number] H   a-lead capped  02/19 upgrade ICD to BiV.  Medtronic [place] MRI Quad CRT-D [id number] sn [id number] H.  New LV lead- Med 4598?88 [id number] V  06/19 excess diaphragmatic pacing from LV lead.  BiV pacing abandon     ? CAD (coronary artery disease) 01/06/2006     Priority: Low     12/2003 cardiac cath in [facility].  EF 35%, LAD 40%, OMB 50%, RCA and LM normal  10/13 regaden thallium: EF 37%, LV 156 mL  Small basal inferolateral perfusion defect suspect nonischemic cardiomyopathy  01/19 regaden thallium: EF 31%, LV 177 mL, fixed inferior fusion defect.  No change, LV larger in defect      ? History of mitral valve replacement 01/20/2018   ? 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 Overactive bladder 02/13/2016     History urgency, frequency, and urge incontinence requiring use of a continence brief. Good emptying with PVR <100 ml.   Mr. Rappaport was counseled on options and would like to try a course of anticholinergic.   Ditropan 10 mg XL daily with refills.      ? Fatigue 05/31/2013   ? BPH with obstruction/lower urinary tract symptoms      2013: Previously on Rapaflo but switched to Tamsulosin per [facility]. D/C'd Tamsulosin d/t dizziness/hypotension ~ Aug 2013.  2014: Seen by Marin Roberts, PA-C. Started on finasteride    02/13/2016: Establishes care with Dr. Zachery Dauer. Continues on Finasteride. AUA SS 24, Bother score 4     ? S/P inguinal herniorrhaphy 04/24/2011   ? Recurrent right inguinal hernia 01/20/2011   ? Warfarin anticoagulation 01/20/2011     [facility] manages     ? History of ventricular tachycardia/nonsustained 08/31/2008   ? Hypoglycemia 08/31/2008   ? Sleep apnea 08/31/2008   ? Non-rheumatic mitral regurgitation 08/31/2008     St Jude mechanical MVR 1998 Dr. Darra Lis.     ? 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  01/22/04 -  HIT in [place], details unknown.     ? Cholecystitis 08/30/2008     a.  11/2004 - cholecystectomy and hernia repair.     ? Tobacco abuse 08/30/2008   ? Essential hypertension 01/06/2006   ? Dyslipidemia 01/06/2006   ? Hypothyroidism 01/06/2006     a.  on replacement for many years, levothyroxine Daily           Review of Systems   Constitution: Negative.   HENT: Negative.    Eyes: Negative.  Cardiovascular: Negative.    Respiratory: Negative.    Endocrine: Negative.    Hematologic/Lymphatic: Negative.    Skin: Negative.    Musculoskeletal: Negative.    Gastrointestinal: Negative.    Genitourinary: Negative.    Neurological: Negative.    Psychiatric/Behavioral: Negative.    Allergic/Immunologic: Negative.        Physical Exam   General Appearance:?no acute distress  Skin:?warm, moist, rash  HEENT:?unremarkable  Neck Veins:?neck veins are mildly distended. ?CVP 12, positive HJR  Carotid Arteries:?normal carotid upstroke bilaterally, no bruits  Chest Inspection:?chest is normal in appearance   Auscultation/Percussion:?lungs clear to auscultation, no rales, rhonchi, or wheezing  Cardiac Rhythm:?regular rhythm and normal rate  Cardiac Auscultation:?Normal S1 &?S2, no S3, no rub, normal prosthetic clicks  Extremities:?Trace lower extremity edema; 2+ symmetric distal pulses  Abdominal Exam:?soft, non-tender, no masses, bowel sounds normal  Liver & Spleen:?no organomegaly  Neurologic Exam:?neurological assessment grossly intact  ?     Current Medications (including today's revisions)  ? acetaminophen (TYLENOL) 500 mg tablet Take 1,000 mg by mouth at bedtime as needed.   ? albuterol (PROVENTIL HFA, VENTOLIN HFA) 90 mcg/Actuation IN inhaler Inhale 1 puff by mouth into the lungs every 6 hours as needed for Wheezing.   ? artificial tears(hypromellose) 0.4 % ophthalmic solution Place 1 drop into or around eye(s) five times daily as needed.   ? aspirin EC 81 mg tablet Take 81 mg by mouth daily.   ? cetirizine (ZYRTEC) 10 mg tablet Take 10 mg by mouth every morning.   ? cholecalciferol (VITAMIN D-3) 1,000 units tablet Take 1,000 Units by mouth daily.   ? coQ10 (ubiquinol) 100 mg cap Take 1 capsule by mouth daily.   ? cyanocobalamin (VITAMIN B-12, RUBRAMIN) 1,000 mcg/mL injection Inject 1 mL into the muscle every 30 days.   ? digoxin (LANOXIN) 125 mcg (0.125 mg) tablet Take one tablet by mouth daily.   ? docusate sodium (COLACE) 50 mg capsule Take 50 mg by mouth twice daily as needed for Constipation.   ? fluticasone (FLONASE) 50 mcg/Actuation nasal spray Apply 1 spray to each nostril as directed at bedtime daily.   ? Guaifenesin 400 mg tab Take 400 mg by mouth twice daily as needed.   ? levothyroxine (SYNTHROID) 75 mcg tablet Take 1 Tab by mouth daily.   ? Lidocaine 4 % ptmd Apply 1 patch topically to affected area daily as needed.   ? metoprolol XL (TOPROL XL) 25 mg extended release tablet Take one tablet by mouth daily.   ? MYRBETRIQ 50 mg tablet Take 1 tablet by mouth daily. (Patient taking differently: Take 25 mg by mouth daily.)   ? sacubitriL-valsartan (ENTRESTO) 49-51 mg tablet Take one tablet by mouth twice daily.   ? saliva, synthetic (MOUTHKOTE) spra Take 2 sprays by mouth or throat as directed as Needed.   ? simethicone (MYLICON) 80 mg chew tablet Chew 80 mg by mouth every 6 hours as needed for Flatulence.   ? spironolactone (ALDACTONE) 25 mg tablet Take one tablet by mouth daily. Take with food.   ? torsemide (DEMADEX) 10 mg tablet Take one tablet by mouth daily.   ? vit A/vit C/vit E/zinc/copper (PRESERVISION AREDS PO) Take 1 tablet by mouth twice daily.   ? warfarin (COUMADIN) 2 mg tablet Take 2 Tabs by mouth daily. 4mg  everyday except Sunday is 2mg  (Patient taking differently: Take  by mouth at bedtime daily. TuThSaSu= 3mg   MWF= 4mg )

## 2019-08-16 IMAGING — CT Head^1_SINUS (Adult)
1 series · 15 of 30 positions shown, 19 images · non-contrast
Comparison: none

[Series 4: sinus 3.0 soft tissue · axial · 0.39mm/px · z∈[+43,+163]mm · 15 of 44 slices shown, 19 images]
[im 2/44  brain]
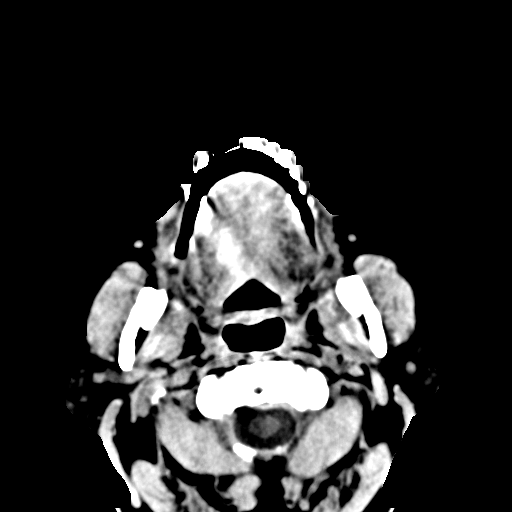
[im 2/44  bone]
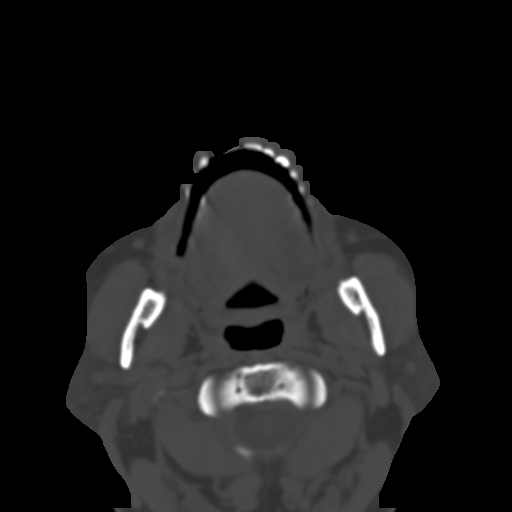
[im 5/44  bone]
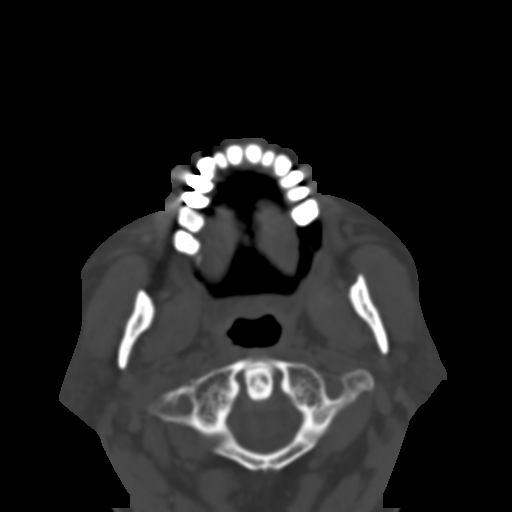
[im 8/44  bone]
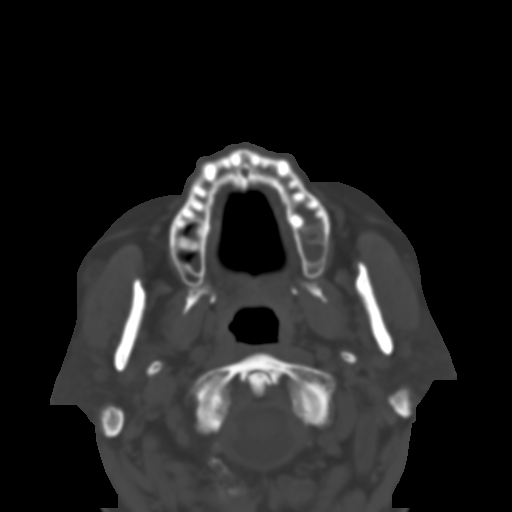
[im 11/44  bone]
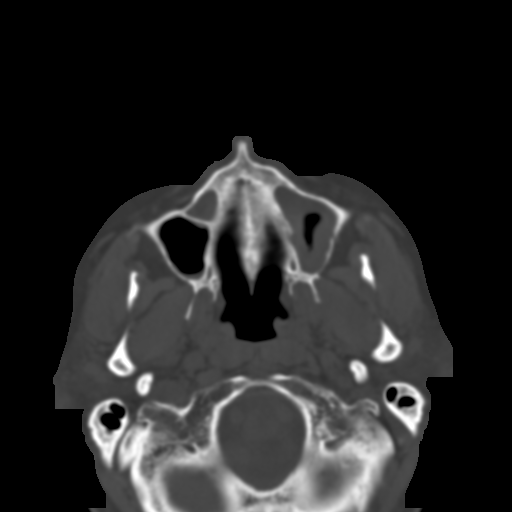
[im 14/44  brain]
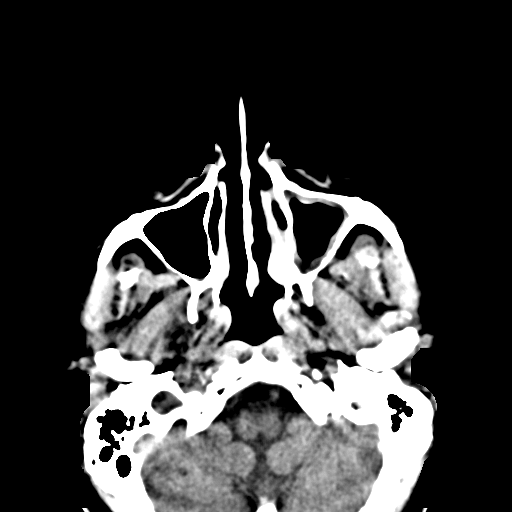
[im 14/44  bone]
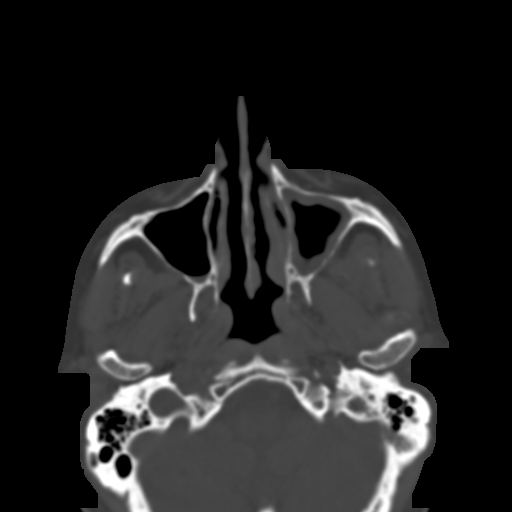
[im 17/44  bone]
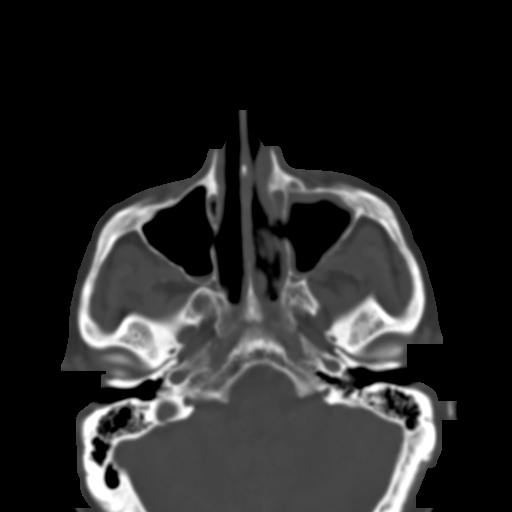
[im 20/44  bone]
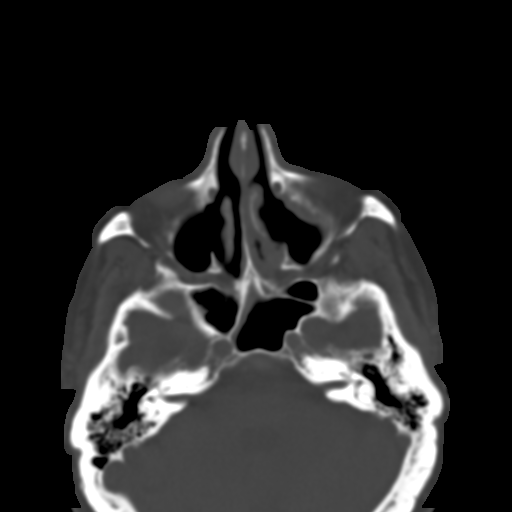
[im 23/44  bone]
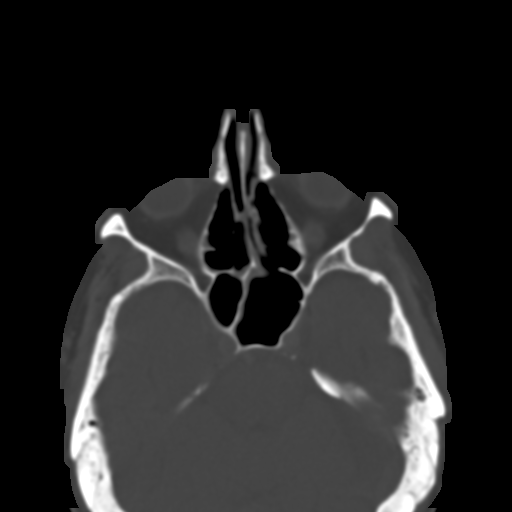
[im 24/44  brain]
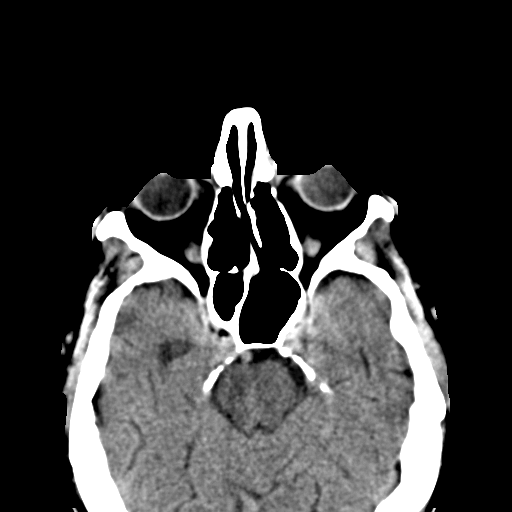
[im 24/44  bone]
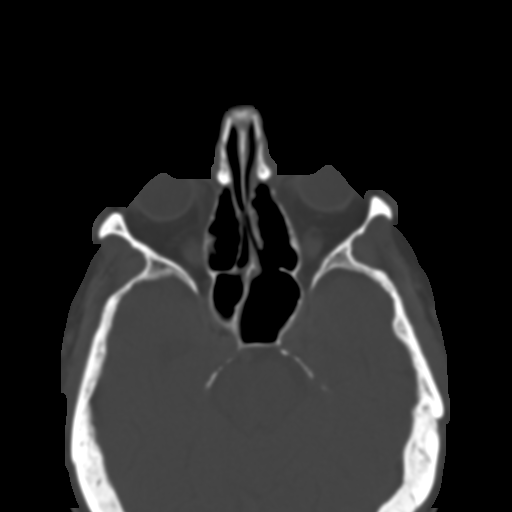
[im 27/44  bone]
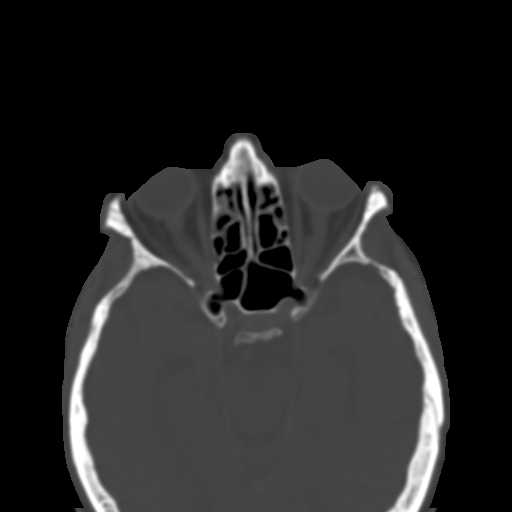
[im 30/44  bone]
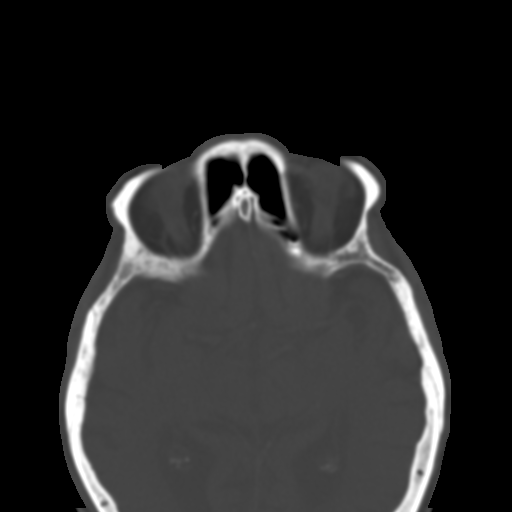
[im 33/44  bone]
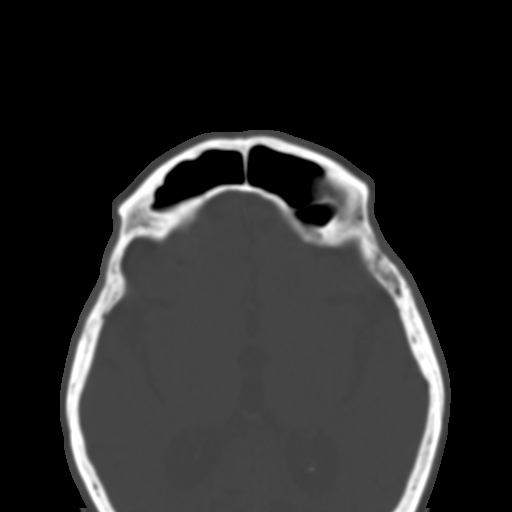
[im 36/44  brain]
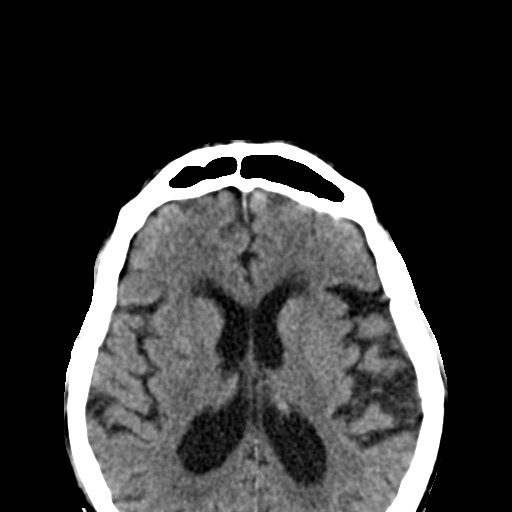
[im 36/44  bone]
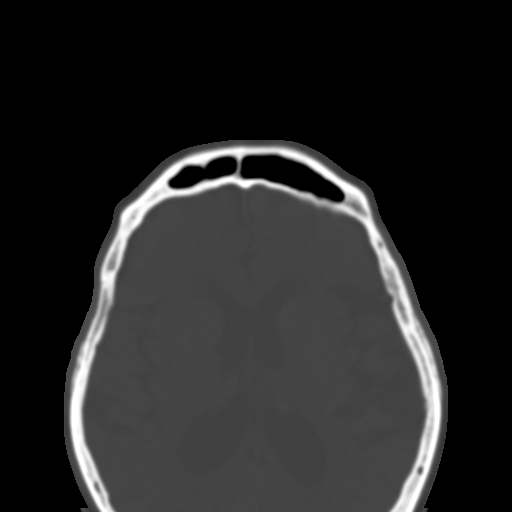
[im 39/44  bone]
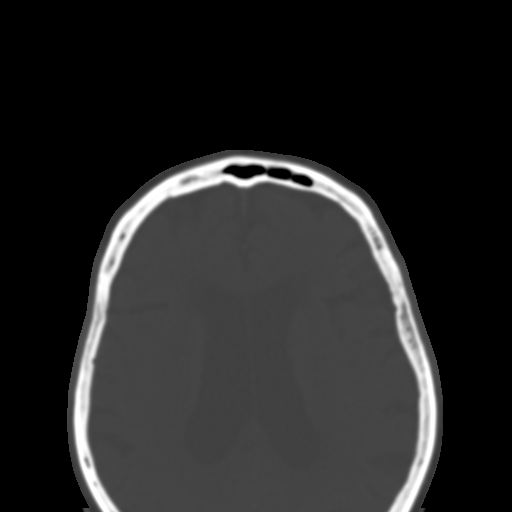
[im 42/44  bone]
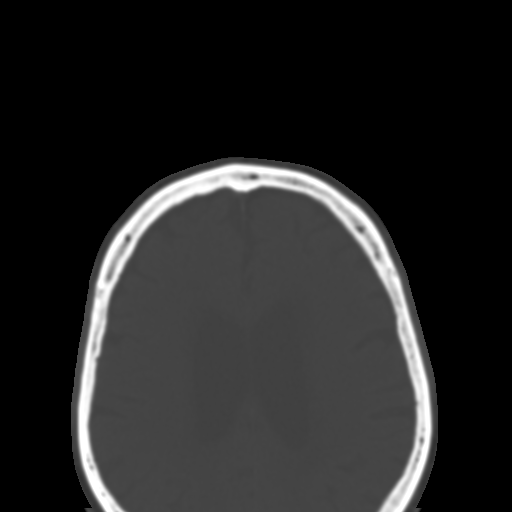

[15 of 30 positions shown; findings below may reference images not displayed]

DIAGNOSTIC STUDIES

EXAM

CT PARANASAL SINUSES WITHOUT CONTRAST

INDICATION

sinus pain, left ear with inflammation/tenderness
RECURRENT SINUS PAIN, CURRENTLY WORSE ON LEFT SIDE. LEFT EAR WITH
INFLAMMATION/TENDERNESS AND BLEEDING - PT STARTED ANTIBIOTIC YESTERDAY - HX
OF LEFT SIDED SINUS SX - AK

TECHNIQUE

Contiguous transaxial imaging through the paranasal sinuses was performed in the absence of
intravenous iodinated contrast material. Coronal reformations were obtained from the transaxial
source data and created by the CT technologist at the workstation.

All CT scans at this facility use dose modulation, iterative reconstruction, and/or weight based
dosing when appropriate to reduce radiation dose to as low as reasonably achievable.

COMPARISONS

None

FINDINGS

The mastoid air cells and middle ear cavities are clear. The nasopharynx is clear. There is no
soft tissue mass or fluid collection. No facial bone fracture is identified. The globes and orbits
are unremarkable. Limited imaging of the inferior aspect of the brain is without midline shift,
mass effect or ventriculomegaly.

There has been previous sinonasal surgery. The frontal sinuses are clear. There are frothy/bubbly
secretions within the right sphenoid sinus. Mild-to-moderate mucosal thickening is present in the
bilateral maxillary, left sphenoid, and ethmoid sinuses. Anterior clinoid processes are aerated.
Nasal septum is midline.

IMPRESSION

Previous sinonasal surgery and mild-to-moderate paranasal sinus mucosal thickening, greatest left
maxillary sinus. Frothy/bubbly secretions right sphenoid sinus suggest acute paranasal sinusitis in
the appropriate clinical setting, correlate with clinical exam.

## 2019-08-20 ENCOUNTER — Encounter: Admit: 2019-08-20 | Discharge: 2019-08-20 | Payer: MEDICARE

## 2019-08-20 DIAGNOSIS — E785 Hyperlipidemia, unspecified: Secondary | ICD-10-CM

## 2019-08-20 DIAGNOSIS — I10 Essential (primary) hypertension: Secondary | ICD-10-CM

## 2019-08-20 DIAGNOSIS — I2510 Atherosclerotic heart disease of native coronary artery without angina pectoris: Secondary | ICD-10-CM

## 2019-08-20 NOTE — Telephone Encounter
Called and discussed with patient.  He has been eating more bananas than usual.  He will decrease his consumption of high potassium foods.      Discussed with Dr. Glean Hess in clinic, no changes at this time.  Repeat BMP in one week.    Pt is agreeable to plan.  Faxed lab order to [facility] per patient request.

## 2019-08-23 ENCOUNTER — Encounter: Admit: 2019-08-23 | Discharge: 2019-08-23 | Payer: MEDICARE

## 2019-08-23 NOTE — Progress Notes
Patient presented to clinic today for a blood pressure check after having lower pressures during his last OV. Patient pressure today was 98/64 with HR of 74. Patient states that he is asymptomatic with lower readings. Wife states that she thinks he has just adpated to the lower pressures by changing positions slower. When asking the patient if he has any symptoms he states not any worse than usual.     In Dr. Mellody Life OV note, he recommends decreasing Entresto. When mentioning this to the patient, patient states, I really don't want to decrease that one. It's the only medication that has actually made me feel better. He requested that I speak with Dr. Doristine Counter again to decide what else we can do with his medication regimen.     Reviewed with JST in clinic who recommends keeping on the same medication regimen since he is asymptomatic and to review BP log again in 2 weeks.     Called and left message with patient explaining JST recommendations. Left a call back number for any questions, comments, or concerns.

## 2019-09-01 ENCOUNTER — Encounter: Admit: 2019-09-01 | Discharge: 2019-09-01 | Payer: MEDICARE

## 2019-09-01 DIAGNOSIS — I428 Other cardiomyopathies: Secondary | ICD-10-CM

## 2019-09-01 DIAGNOSIS — Z95810 Presence of automatic (implantable) cardiac defibrillator: Secondary | ICD-10-CM

## 2019-11-25 ENCOUNTER — Encounter: Admit: 2019-11-25 | Discharge: 2019-11-25 | Payer: MEDICARE

## 2019-11-25 NOTE — Progress Notes
Joseph Hoover, Apr 06, 1940 has an appointment with Dr. Doristine Counter on 12/01/19.    Please send recent lab results for continuity of care.    Thank you,   [name]    Phone: [phone]  Fax: [phone]

## 2019-12-01 ENCOUNTER — Ambulatory Visit: Admit: 2019-12-01 | Discharge: 2019-12-01 | Payer: MEDICARE

## 2019-12-01 ENCOUNTER — Encounter: Admit: 2019-12-01 | Discharge: 2019-12-01 | Payer: MEDICARE

## 2019-12-01 DIAGNOSIS — K635 Polyp of colon: Secondary | ICD-10-CM

## 2019-12-01 DIAGNOSIS — N390 Urinary tract infection, site not specified: Secondary | ICD-10-CM

## 2019-12-01 DIAGNOSIS — K219 Gastro-esophageal reflux disease without esophagitis: Secondary | ICD-10-CM

## 2019-12-01 DIAGNOSIS — R040 Epistaxis: Secondary | ICD-10-CM

## 2019-12-01 DIAGNOSIS — E785 Hyperlipidemia, unspecified: Secondary | ICD-10-CM

## 2019-12-01 DIAGNOSIS — Z8679 Personal history of other diseases of the circulatory system: Secondary | ICD-10-CM

## 2019-12-01 DIAGNOSIS — I2510 Atherosclerotic heart disease of native coronary artery without angina pectoris: Secondary | ICD-10-CM

## 2019-12-01 DIAGNOSIS — I340 Nonrheumatic mitral (valve) insufficiency: Secondary | ICD-10-CM

## 2019-12-01 DIAGNOSIS — R32 Unspecified urinary incontinence: Secondary | ICD-10-CM

## 2019-12-01 DIAGNOSIS — Z789 Other specified health status: Secondary | ICD-10-CM

## 2019-12-01 DIAGNOSIS — I10 Essential (primary) hypertension: Secondary | ICD-10-CM

## 2019-12-01 DIAGNOSIS — I219 Acute myocardial infarction, unspecified: Secondary | ICD-10-CM

## 2019-12-01 DIAGNOSIS — Z95810 Presence of automatic (implantable) cardiac defibrillator: Secondary | ICD-10-CM

## 2019-12-01 DIAGNOSIS — Z9889 Other specified postprocedural states: Secondary | ICD-10-CM

## 2019-12-01 DIAGNOSIS — I480 Paroxysmal atrial fibrillation: Secondary | ICD-10-CM

## 2019-12-01 DIAGNOSIS — Z9229 Personal history of other drug therapy: Secondary | ICD-10-CM

## 2019-12-01 DIAGNOSIS — K469 Unspecified abdominal hernia without obstruction or gangrene: Secondary | ICD-10-CM

## 2019-12-01 DIAGNOSIS — I428 Other cardiomyopathies: Secondary | ICD-10-CM

## 2019-12-01 DIAGNOSIS — G4730 Sleep apnea, unspecified: Secondary | ICD-10-CM

## 2019-12-01 DIAGNOSIS — N529 Male erectile dysfunction, unspecified: Secondary | ICD-10-CM

## 2019-12-01 DIAGNOSIS — Z7901 Long term (current) use of anticoagulants: Secondary | ICD-10-CM

## 2019-12-01 DIAGNOSIS — D7582 Heparin induced thrombocytopenia (HIT): Secondary | ICD-10-CM

## 2019-12-01 DIAGNOSIS — Z952 Presence of prosthetic heart valve: Secondary | ICD-10-CM

## 2019-12-01 DIAGNOSIS — N401 Enlarged prostate with lower urinary tract symptoms: Secondary | ICD-10-CM

## 2019-12-01 DIAGNOSIS — N400 Enlarged prostate without lower urinary tract symptoms: Secondary | ICD-10-CM

## 2019-12-01 DIAGNOSIS — E039 Hypothyroidism, unspecified: Secondary | ICD-10-CM

## 2019-12-01 DIAGNOSIS — I4821 Permanent atrial fibrillation: Secondary | ICD-10-CM

## 2019-12-01 DIAGNOSIS — E162 Hypoglycemia, unspecified: Secondary | ICD-10-CM

## 2019-12-01 DIAGNOSIS — M1990 Unspecified osteoarthritis, unspecified site: Secondary | ICD-10-CM

## 2019-12-01 DIAGNOSIS — T50905A Adverse effect of unspecified drugs, medicaments and biological substances, initial encounter: Secondary | ICD-10-CM

## 2019-12-01 MED ORDER — TORSEMIDE 10 MG PO TAB
10 mg | ORAL_TABLET | ORAL | 3 refills | 67.50000 days | Status: AC
Start: 2019-12-01 — End: ?

## 2019-12-01 MED ORDER — SACUBITRIL-VALSARTAN 24-26 MG PO TAB
1 | ORAL_TABLET | Freq: Two times a day (BID) | ORAL | 11 refills | Status: AC
Start: 2019-12-01 — End: ?

## 2019-12-01 MED ORDER — PERFLUTREN LIPID MICROSPHERES 1.1 MG/ML IV SUSP
1-20 mL | Freq: Once | INTRAVENOUS | 0 refills | Status: CP | PRN
Start: 2019-12-01 — End: ?
  Administered 2019-12-01: 20:00:00 2 mL via INTRAVENOUS

## 2019-12-01 NOTE — Progress Notes
Date of Service: 12/01/2019    Joseph Hoover is a 79 y.o. male.       HPI     Joseph Hoover is a delightful 79 year old gentleman followed through the self this for permanent atrial fibrillation, dilated nonischemic cardiomyopathy, mechanical mitral valve replacement 1998, severe LAE, ICD with ?failed LV leads past and present, recurrent polymorphic VT and PVCs, nonobstructive CAD, and heart failure with reduced ejection fraction. ?An ICD implant in 2005 epicardial LV leads were attempted but failed an attempt and a CS lead failed?and an LV lead failed?due to diaphragmatic pacing. ?He is programmed at [facility] with his ICD currently. ?His atrial fibrillation failed treatment with amiodarone and dofetilide. ?Not a candidate for ablation due to left atrium over 6 cm.??He is not a good candidate for advanced heart failure therapies.??    Joseph Hoover is back for follow-up of his cardiomyopathy and heart failure.  When I saw him last he had been having difficulty with lightheadedness and orthostatic symptoms.  His blood pressure was quite low.  We backed off on his diuretic to 3 times a week and his symptoms improved.  He is not had any episodes where to go the emergency room but he still gets a bit lightheaded.  This usually occurs after she is been sitting for a while and stands up and is worse later in the day.  He still a bit hypotensive today.  Blood pressure was 95/64.  There is not a significant drop in his blood pressure getting up but I still concerned about this.  I think we need to decrease his dose of Entresto.  He is not having any heart failure symptoms.  Not having PND, orthopnea, or shortness of breath.    EKG today demonstrates ventricular paced rhythm with PVCs and some underlying beats with high voltage and ST segment changes.    Examination shows no JVD.  Carotid pulses normal.  Chest is clear.  Heart is regular with no S3  Trace edema    Impression  1.  Hypotension and orthostatic hypotension persist after backing off on diuretic.  We will need to cut his Entresto dose down to 24/26 weeks not to hypotensive.  2.  Heart failure reduced ejection fraction.  He is on guideline directed medical therapy.  Functional class II symptoms.  3.  Intermittent orthostatic hypotension lightheadedness.  4.  ICD functioning normally  5.  Dilated/ischemic cardiomyopathy  6.  Permanent atrial fibrillation.  Heart rate controlled  7.  Chronic anticoagulation with warfarin for his atrial fibrillation  8.  Status post mitral valve replacement sent to mechanical valve.  Needs permanent anticoagulation with warfarin  9.  Sleep apnea  10.  Ventricular tachycardia.  11.  Hypertension in the past.  Currently hypotensive  12.  Hypercholesterolemia treated    Plan  We will decrease his Entresto to 24/26 mg twice daily.  We will continue with the torsemide.  We discussed SGLT2 inhibitor.  I think we should avoid this for now with the hypotension.  I discussed the concept of using this in the future and he agrees.           Vitals:    12/01/19 1406   BP: 95/64   BP Source: Arm, Right Upper   Patient Position: Sitting   Pulse: 81   Weight: 90.8 kg (200 lb 3.2 oz)   Height: 1.803 m (5' 11)   PainSc: Zero     Body mass index is 27.92  kg/m?.     Past Medical History  Patient Active Problem List    Diagnosis Date Noted   ? NICM (nonischemic cardiomyopathy) (HCC) 01/29/2011     Priority: Low     12/2003 cath in [place]: LAD 40% OMB 50% RCA normal  02/10-Echo with EF of 50% and a left atrial size of 5.7 cm    Stress test negative for any significant ischemia, EF 50%  09/13 echo: EF 40% LA 6.7, LV 6.7 mild LVH, mechanical mitral valve well-seated, mean gradient 4, moderate RAE, mild AI  10/13 regaden thallium: EF 37%, LV 156 mL  Small basal inferolateral perfusion defect suspect nonischemic cardiomyopathy  01/15 echo: EF 30-35%, LV 6.7, LA 6.2, RV normal, normal Saint Jude bileaflet mitral valve, trace MR, mod TR, PAP 37  10/15 echo: EF 25%, normal mitral valve mechanical prosthesis, mean grad 3, no MR, no effusion, LV 5.6 LA 4.7, PAP 36  4/18 echo, TDS with PVCs.  EF 25%, LV 6.0, mild LVH, LA 5.9, RV normal, normal mechanical MV, mean grad 4, mild AI  6/19 echo EF 20%, LV 6.6, LA 5.7, RV enlarged with normal function, normal Saint Jude mechanical valve,  mn grad 3, PA P 31     ? Permanent atrial fibrillation 09/01/2008     Priority: Low     a.  Initial presentation in [place] 10/05.  At that time, his EF was 35% (although all prior                    and subsequent EF at [facility] (through 2006) showed an EF of 50% or greater).  They                 placed a CRT ICD (he is hooked up to an epicardial LV lead).  They had planned to                    ablate his node but did not. His QRS was relatively narrow (per [facility]DB: this strategy                    seems to make little sense).              b.  10/05=>Spring 2007 - No AF on amiodarone 200 mg daily. Amiodarone was held for          two  months because of a questionable rash (it turned out just to be dry skin). At the                     end of two months he had three 10 hour episodes of AF. Amiodarone was reinstituted        with no additional AF over the next six months.                 c.  C.  Status post amiodarone discontinuation and Tikosyn initiation in January 2009.   D. Recurrent AF. D/C Tikosyn. Rate control. AVJ ablation in future if needed.       ? ICD (implantable cardioverter-defibrillator), single, in situ 08/30/2008     Priority: Low     11/2003 [place].  Low EF with atrial fibrillation.  CRT-D placed with Guidant Contak Hg 179, Medtronic 446 A-lead, Guidant 0185 RV lead a Medtronic 4402 transvenous lead.  Leads failed.  Epicardial lead placed  01/06 Three  ICD shocks for atrial fibrillation.  Amiodarone  Started  12/07  Medtronic [id number] dual-chamber ICD programmed to AAIR/DDDR mode.  Both LV leads failed-capped EF improved  01/15 generator change.  New Medtronic [place] VR [id number] sn [id number] H   a-lead capped  02/19 upgrade ICD to BiV.  Medtronic [place] MRI Quad CRT-D [id number] sn [id number] H.  New LV lead- Med 4598?88 [id number] V  06/19 excess diaphragmatic pacing from LV lead.  BiV pacing abandon     ? CAD (coronary artery disease) 01/06/2006     Priority: Low     12/2003 cardiac cath in [facility]  EF 35%, LAD 40%, OMB 50%, RCA and LM normal  10/13 regaden thallium: EF 37%, LV 156 mL  Small basal inferolateral perfusion defect suspect nonischemic cardiomyopathy  01/19 regaden thallium: EF 31%, LV 177 mL, fixed inferior fusion defect.  No change, LV larger in defect      ? History of mitral valve replacement 01/20/2018   ? 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 Overactive bladder 02/13/2016     History urgency, frequency, and urge incontinence requiring use of a continence brief. Good emptying with PVR <100 ml.   Joseph Hoover was counseled on options and would like to try a course of anticholinergic.   Ditropan 10 mg XL daily with refills.      ? Fatigue 05/31/2013   ? BPH with obstruction/lower urinary tract symptoms      2013: Previously on Rapaflo but switched to Tamsulosin per [facility] formulary. D/C'd Tamsulosin d/t dizziness/hypotension ~ Aug 2013.  2014: Seen by Marin Roberts, PA-C. Started on finasteride    02/13/2016: Establishes care with Dr. Zachery Dauer. Continues on Finasteride. AUA SS 24, Bother score 4     ? S/P inguinal herniorrhaphy 04/24/2011   ? Recurrent right inguinal hernia 01/20/2011   ? Warfarin anticoagulation 01/20/2011     [facility] manages     ? History of ventricular tachycardia/nonsustained 08/31/2008   ? Hypoglycemia 08/31/2008   ? Sleep apnea 08/31/2008   ? Non-rheumatic mitral regurgitation 08/31/2008     St Jude mechanical MVR 1998 Dr. Darra Lis.     ? 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  01/22/04 -  HIT in [place], details unknown.     ? Cholecystitis 08/30/2008     a.  11/2004 - cholecystectomy and hernia repair.     ? Tobacco abuse 08/30/2008 ? Essential hypertension 01/06/2006   ? Dyslipidemia 01/06/2006   ? Hypothyroidism 01/06/2006     a.  on replacement for many years, levothyroxine Daily           Review of Systems   Constitutional: Negative.   HENT: Negative.    Eyes: Negative.    Cardiovascular: Negative.    Respiratory: Negative.    Endocrine: Negative.    Hematologic/Lymphatic: Negative.    Skin: Negative.    Gastrointestinal: Negative.    Genitourinary: Negative.    Neurological: Negative.    Psychiatric/Behavioral: Negative.    Allergic/Immunologic: Negative.        Physical Exam   General Appearance:?no acute distress  Skin:?warm, moist, rash  HEENT:?unremarkable  Neck Veins:?neck veins are mildly distended. ?CVP 12, positive HJR  Carotid Arteries:?normal carotid upstroke bilaterally, no bruits  Chest Inspection:?chest is normal in appearance   Auscultation/Percussion:?lungs clear to auscultation, no rales, rhonchi, or wheezing  Cardiac Rhythm:?regular rhythm and normal rate  Cardiac Auscultation:?Normal S1 &?S2, no S3, no rub, normal prosthetic clicks  Extremities:?Trace lower extremity edema; 2+ symmetric distal pulses  Abdominal Exam:?soft, non-tender, no masses, bowel sounds normal  Liver & Spleen:?no organomegaly  Neurologic Exam:?neurological assessment grossly intact      Current Medications (including today's revisions)  ? acetaminophen (TYLENOL) 500 mg tablet Take 1,000 mg by mouth at bedtime as needed.   ? albuterol (PROVENTIL HFA, VENTOLIN HFA) 90 mcg/Actuation IN inhaler Inhale 1 puff by mouth into the lungs every 6 hours as needed for Wheezing.   ? artificial tears(hypromellose) 0.4 % ophthalmic solution Place 1 drop into or around eye(s) five times daily as needed.   ? aspirin EC 81 mg tablet Take 81 mg by mouth daily.   ? cetirizine (ZYRTEC) 10 mg tablet Take 10 mg by mouth every morning.   ? cholecalciferol (VITAMIN D-3) 1,000 units tablet Take 1,000 Units by mouth daily.   ? coQ10 (ubiquinol) 100 mg cap Take 1 capsule by mouth daily.   ? cyanocobalamin (VITAMIN B-12, RUBRAMIN) 1,000 mcg/mL injection Inject 1 mL into the muscle every 30 days.   ? digoxin (LANOXIN) 125 mcg (0.125 mg) tablet Take one tablet by mouth daily.   ? docusate sodium (COLACE) 50 mg capsule Take 50 mg by mouth twice daily as needed for Constipation.   ? fluticasone (FLONASE) 50 mcg/Actuation nasal spray Apply 1 spray to each nostril as directed at bedtime daily.   ? Guaifenesin 400 mg tab Take 400 mg by mouth twice daily as needed.   ? levothyroxine (SYNTHROID) 75 mcg tablet Take 1 Tab by mouth daily.   ? Lidocaine 4 % ptmd Apply 1 patch topically to affected area daily as needed.   ? metoprolol XL (TOPROL XL) 25 mg extended release tablet Take one tablet by mouth daily.   ? MYRBETRIQ 50 mg tablet Take 1 tablet by mouth daily. (Patient taking differently: Take 25 mg by mouth daily.)   ? other medication Take 1 Dose by mouth daily. prevergen   ? sacubitriL-valsartan (ENTRESTO) 24-26 mg tablet Take one tablet by mouth twice daily.   ? saliva, synthetic (MOUTHKOTE) spra Take 2 sprays by mouth or throat as directed as Needed.   ? simethicone (MYLICON) 80 mg chew tablet Chew 80 mg by mouth every 6 hours as needed for Flatulence.   ? spironolactone (ALDACTONE) 25 mg tablet Take one tablet by mouth daily. Take with food.   ? torsemide (DEMADEX) 10 mg tablet Take one tablet by mouth three times weekly.   ? vit A/vit C/vit E/zinc/copper (PRESERVISION AREDS PO) Take 1 tablet by mouth twice daily.   ? warfarin (COUMADIN) 2 mg tablet Take 2 Tabs by mouth daily. 4mg  everyday except Sunday is 2mg  (Patient taking differently: Take  by mouth at bedtime daily. TuThSaSu= 3mg   MWF= 4mg )

## 2019-12-03 ENCOUNTER — Encounter: Admit: 2019-12-03 | Discharge: 2019-12-03 | Payer: MEDICARE

## 2019-12-07 ENCOUNTER — Encounter: Admit: 2019-12-07 | Discharge: 2019-12-07 | Payer: MEDICARE

## 2019-12-07 DIAGNOSIS — I428 Other cardiomyopathies: Secondary | ICD-10-CM

## 2019-12-07 DIAGNOSIS — I2510 Atherosclerotic heart disease of native coronary artery without angina pectoris: Secondary | ICD-10-CM

## 2019-12-07 DIAGNOSIS — I10 Essential (primary) hypertension: Secondary | ICD-10-CM

## 2019-12-07 DIAGNOSIS — Z952 Presence of prosthetic heart valve: Secondary | ICD-10-CM

## 2019-12-17 ENCOUNTER — Encounter: Admit: 2019-12-17 | Discharge: 2019-12-17 | Payer: MEDICARE

## 2019-12-17 ENCOUNTER — Ambulatory Visit: Admit: 2019-12-17 | Discharge: 2019-12-17 | Payer: MEDICARE

## 2019-12-17 DIAGNOSIS — I428 Other cardiomyopathies: Secondary | ICD-10-CM

## 2019-12-17 DIAGNOSIS — Z95810 Presence of automatic (implantable) cardiac defibrillator: Secondary | ICD-10-CM

## 2020-02-07 ENCOUNTER — Encounter: Admit: 2020-02-07 | Discharge: 2020-02-07 | Payer: MEDICARE

## 2020-02-07 NOTE — Progress Notes
Joseph Hoover, April 04, 1940 has an appointment with Dr. Doristine Counter on 02/15/20.    Please send recent lab results for continuity of care.    Thank you,   [name]    Phone: [phone]  Fax: [phone]

## 2020-02-07 NOTE — Progress Notes
Per [facility] no recent labs.

## 2020-02-15 NOTE — Progress Notes
Date of Service: 02/15/2020    Joseph Hoover is a 80 y.o. male.       HPI     Joseph Hoover is a delightful 80 year old gentleman followed through the [facility] cardiology office for permanent atrial fibrillation, dilated nonischemic cardiomyopathy, mechanical mitral valve replacement 1998, severe LAE, ICD with ?failed LV leads past and present, recurrent polymorphic VT and PVCs, nonobstructive CAD, and heart failure with reduced ejection fraction. ?An ICD implant in [place] in 2005 epicardial LV leads were attempted but failed.  An attempt to place a CS lead failed?and LV lead failed in 2019?due to diaphragmatic pacing. ?He is programmed at [facility] with his ICD currently. ?His atrial fibrillation failed treatment with amiodarone and dofetilide. ?He not a candidate for ablation due to left atrium over 6 cm.??He is not a good candidate for advanced heart failure therapies.??Over the last year we had to decrease his Entresto and diuretic dose due to hypotension.    Joseph Hoover is back for routine follow-up.  He is not doing as well.  He has progressive dyspnea and decreased exercise capacity.  He cannot go out and feed the cat or dog without having dyspnea now.  He can barely walk to the car.  He has some orthopnea.  He is and lightheadedness when he gets up but it is better since we reduced the Entresto dose.  Today's blood pressure in the office is 106/67 and when standing was 98/56.  He is not symptomatic with that.  His last echo showed an EF of 20% with left ventricle 6.6 cm 2 years ago.  He has not had significant coronary disease so I doubt there is a progression of his coronary disease.  He does watch his sodium carefully.  Not able to exercise or be active at all.    There is no reason is not treating his heart failure has been challenging.  He does not have biventricular pacing and failed attempts in [place] and at [facility] for both epicardial and CS/PL vein positioning of the device.  There is lead and placement diaphragmatic pacing has prevented use.  Therefore VVI paced.  We have had to back off on his vasodilators due to hypotension.  He is not able to be active.  On the current medical regimen he is not having difficulty with severe orthostatic lightheadedness but does have occasional dizziness.  His last echo was 2019 to check his mitral valve prosthesis.  We should repeat this as well.  He is not having chest pain or angina.    Today's exam shows no JVD.  CVP appears to be 6 or less.  Carotid pulse demonstrates pulses parvus at tardus compatible with low stroke-volume.  There are fine right basilar crackles with no other rales or wheezes.  Heart is regular.  Do not hear S4 or S3.  S2 is split.  Abdomen is soft.  Liver span is 6 to 7 cm and there is not felt below the costal margin.  He has trace edema.    Impression  1.  Heart failure with reduced ejection fraction.  Chronic combined systolic/diastolic heart failure.  Functional class III symptoms as he is worsened.  #2 dilated/nonischemic cardiomyopathy  3.  Permanent atrial fibrillation  4.  ICD functioning normally.  VVI device with failure of LV pacing.  5.  Orthostatic hypotension.  6.  History of hypertension.  Currently have had reduced Entresto due to hypotension  7.  Chronic anticoagulant warfarin  8.  Status post mitral valve replacement with mechanical valve  9.  Sleep apnea  10.  Ventricular tachycardia  11.  History of hypercholesterolemia    Plan  1.  Joseph Hoover is clearly been failing.  We talked about starting SGLT2 inhibitor last visit and I think we should do so at this time.  We will start Jardiance 10 mg daily.  2.  We will see him back in 1 month.  At that time we will plan on increasing his Entresto and if necessary decrease his metoprolol and spironolactone due to blood pressure concerns.  3.  Will also discussed with Dr. Wallene Huh, who has been managing his ICD, he would benefit from CCM device.  This is an new septal pacing device that is possibly helpful for heart failure patients although I am not sure Joseph Hoover fits the criteria.  4.  We will see him back in 1 month to make further adjustments in medications.  5.  We will repeat an echocardiogram       Vitals:    02/15/20 1058 02/15/20 1059 02/15/20 1101   BP: 106/60 (!) 89/55 98/56   BP Source: Arm, Right Upper Arm, Right Upper Arm, Right Upper   Patient Position: Supine Sitting Standing   Pulse: 78 55 88   SpO2: 99% 99%    Weight: 92 kg (202 lb 14.4 oz)     Height: 1.803 m (5' 11)     PainSc: Zero       Body mass index is 28.3 kg/m?Marland Kitchen     Past Medical History  Patient Active Problem List    Diagnosis Date Noted   ? NICM (nonischemic cardiomyopathy) (HCC) 01/29/2011     Priority: Low     12/2003 cath in [place]: LAD 40% OMB 50% RCA normal  02/10-Echo with EF of 50% and a left atrial size of 5.7 cm    Stress test negative for any significant ischemia, EF 50%  09/13 echo: EF 40% LA 6.7, LV 6.7 mild LVH, mechanical mitral valve well-seated, mean gradient 4, moderate RAE, mild AI  10/13 regaden thallium: EF 37%, LV 156 mL  Small basal inferolateral perfusion defect suspect nonischemic cardiomyopathy  01/15 echo: EF 30-35%, LV 6.7, LA 6.2, RV normal, normal Saint Jude bileaflet mitral valve, trace MR, mod TR, PAP 37  10/15 echo: EF 25%, normal mitral valve mechanical prosthesis, mean grad 3, no MR, no effusion, LV 5.6 LA 4.7, PAP 36  4/18 echo, TDS with PVCs.  EF 25%, LV 6.0, mild LVH, LA 5.9, RV normal, normal mechanical MV, mean grad 4, mild AI  6/19 echo EF 20%, LV 6.6, LA 5.7, RV enlarged with normal function, normal Saint Jude mechanical valve,  mn grad 3, PA P 31  05/20 Echo: EF 25% apical akinesis RVE LAE, normal Saint Jude mechanical mitral valve, PAP 27  10/21 echo: EF 30%, severe global hypokinesis, apical akinesis, RVE with decreased EF, LAE, normal mechanical valve, PAP 29, moderate TR     ? Permanent atrial fibrillation 09/01/2008     Priority: Low     a.  Initial presentation in [place] 10/05.  At that time, his EF was 35% (although all prior                    and subsequent EF at [facility] (through 2006) showed an EF of 50% or greater).  They                 placed a  CRT ICD (he is hooked up to an epicardial LV lead).  They had planned to                    ablate his node but did not. His QRS was relatively narrow (per LDB: this strategy                    seems to make little sense).              b.  10/05=>Spring 2007 - No AF on amiodarone 200 mg daily. Amiodarone was held for          two  months because of a questionable rash (it turned out just to be dry skin). At the                     end of two months he had three 10 hour episodes of AF. Amiodarone was reinstituted        with no additional AF over the next six months.                 c.  C.  Status post amiodarone discontinuation and Tikosyn initiation in January 2009.   D. Recurrent AF. D/C Tikosyn. Rate control. AVJ ablation in future if needed.       ? ICD (implantable cardioverter-defibrillator), single, in situ 08/30/2008     Priority: Low     11/2003 [place].  Low EF with atrial fibrillation.  CRT-D placed with Guidant Contak Hg 179, Medtronic 446 A-lead, Guidant 0185 RV lead a Medtronic 4402 transvenous lead.  Leads failed.  Epicardial lead placed  01/06 Three  ICD shocks for atrial fibrillation.  Amiodarone  Started  12/07  Medtronic [id number] dual-chamber ICD programmed to AAIR/DDDR mode.  Both LV leads failed-capped EF improved  01/15 generator change.  New Medtronic Central Pacolet VR [id number] sn [id number] H   a-lead capped  02/19 upgrade ICD to BiV.  Medtronic Clarita MRI Quad CRT-D [id number] sn [id number] H.  New LV lead- Med 4598?88 [id number] V  06/19 excess diaphragmatic pacing from LV lead.  BiV pacing abandon     ? Coronary artery disease involving native coronary artery of native heart without angina pectoris 01/06/2006     Priority: Low     12/2003 cardiac cath in [facility].  EF 35%, LAD 40%, OMB 50%, RCA and LM normal  10/13 regaden thallium: EF 37%, LV 156 mL  Small basal inferolateral perfusion defect suspect nonischemic cardiomyopathy  01/19 regaden thallium: EF 31%, LV 177 mL, fixed inferior fusion defect.  No change, LV larger in defect      ? Chronic combined systolic and diastolic heart failure (HCC) 02/15/2020   ? History of mitral valve replacement 01/20/2018   ? 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 Overactive bladder 02/13/2016     History urgency, frequency, and urge incontinence requiring use of a continence brief. Good emptying with PVR <100 ml.   Joseph Hoover was counseled on options and would like to try a course of anticholinergic.   Ditropan 10 mg XL daily with refills.      ? Fatigue 05/31/2013   ? BPH with obstruction/lower urinary tract symptoms      2013: Previously on Rapaflo but switched to Tamsulosin per [facility] formulary. D/C'd Tamsulosin d/t dizziness/hypotension ~ Aug 2013.  2014: Seen by Marin Roberts, PA-C. Started on finasteride    02/13/2016: Establishes care with Dr. Zachery Dauer. Continues on Finasteride. AUA SS  24, Bother score 4     ? S/P inguinal herniorrhaphy 04/24/2011   ? Recurrent right inguinal hernia 01/20/2011   ? Warfarin anticoagulation 01/20/2011     [facility] manages     ? History of ventricular tachycardia/nonsustained 08/31/2008   ? Hypoglycemia 08/31/2008   ? Sleep apnea 08/31/2008   ? Non-rheumatic mitral regurgitation 08/31/2008     St Jude mechanical MVR 1998 Dr. Darra Lis.     ? 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  01/22/04 -  HIT in [place], details unknown.     ? Cholecystitis 08/30/2008     a.  11/2004 - cholecystectomy and hernia repair.     ? Tobacco abuse 08/30/2008   ? Essential hypertension 01/06/2006   ? Dyslipidemia 01/06/2006   ? Hypothyroidism 01/06/2006     a.  on replacement for many years, levothyroxine Daily           Review of Systems   Constitutional: Negative.   HENT: Negative.    Eyes: Negative.    Cardiovascular: Negative.    Respiratory: Positive for shortness of breath.    Endocrine: Negative.    Hematologic/Lymphatic: Negative.    Skin: Negative.    Gastrointestinal: Negative.    Genitourinary: Negative.    Neurological: Positive for dizziness.   Psychiatric/Behavioral: Negative.    Allergic/Immunologic: Negative.        Physical Exam   General Appearance:?no acute distress  Skin:?warm, moist, rash  HEENT:?unremarkable  Neck Veins:?neck veins are mildly distended. ?CVP 12, positive HJR  Carotid Arteries:?normal carotid upstroke bilaterally, no bruits  Chest Inspection:?chest is normal in appearance   Auscultation/Percussion:?lungs clear to auscultation, no rales, rhonchi, or wheezing  Cardiac Rhythm:?regular rhythm and normal rate  Cardiac Auscultation:?Normal S1 &?S2, no S3, no rub, normal prosthetic clicks  Extremities:?Trace lower extremity edema; 2+ symmetric distal pulses  Abdominal Exam:?soft, non-tender, no masses, bowel sounds normal  Liver & Spleen:?no organomegaly  Neurologic Exam:?neurological assessment grossly intact      Cardiovascular Health Factors  Vitals BP Readings from Last 3 Encounters:   02/15/20 98/56   12/01/19 95/64   12/01/19 95/64     Wt Readings from Last 3 Encounters:   02/15/20 92 kg (202 lb 14.4 oz)   12/01/19 90.8 kg (200 lb 3.2 oz)   12/01/19 90.8 kg (200 lb 3.2 oz)     BMI Readings from Last 3 Encounters:   02/15/20 28.30 kg/m?   12/01/19 27.92 kg/m?   12/01/19 27.92 kg/m?      Smoking Social History     Tobacco Use   Smoking Status Former Smoker   ? Packs/day: 1.50   ? Years: 20.00   ? Pack years: 30.00   ? Types: Cigarettes   ? Quit date: 02/04/1982   ? Years since quitting: 38.0   Smokeless Tobacco Never Used      Lipid Profile Cholesterol   Date Value Ref Range Status   05/29/2018 216 (H) <200 Final     HDL   Date Value Ref Range Status   05/29/2018 28 (L) >40 Final     LDL   Date Value Ref Range Status   05/29/2018 148 (H) <100 Final     Triglycerides   Date Value Ref Range Status   05/29/2018 198 (H) <150 Final Blood Sugar No results found for: HGBA1C  Glucose   Date Value Ref Range Status   12/06/2019 96  Final   08/19/2019 97  Final   04/18/2019  110 (H) 70 - 105 Final   09/24/2005 106 70 - 110 MG/DL Final   16/11/9602 540 (H) 70 - 110 MG/DL Final   98/12/9145 829 (H) 70 - 110 MG/DL Final     Glucose, POC   Date Value Ref Range Status   10/01/2005 124 (H) 70 - 110 MG/DL Final   56/21/3086 578 (H) 70 - 110 MG/DL Final   46/96/2952 841 (H) 70 - 110 MG/DL Final          Current Medications (including today's revisions)  ? acetaminophen (TYLENOL) 500 mg tablet Take 1,000 mg by mouth at bedtime as needed.   ? albuterol (PROVENTIL HFA, VENTOLIN HFA) 90 mcg/Actuation IN inhaler Inhale 1 puff by mouth into the lungs every 6 hours as needed for Wheezing.   ? artificial tears(hypromellose) 0.4 % ophthalmic solution Place 1 drop into or around eye(s) five times daily as needed.   ? aspirin EC 81 mg tablet Take 81 mg by mouth daily.   ? cetirizine (ZYRTEC) 10 mg tablet Take 10 mg by mouth every morning.   ? cholecalciferol (VITAMIN D-3) 1,000 units tablet Take 1,000 Units by mouth daily.   ? coQ10 (ubiquinol) 100 mg cap Take 1 capsule by mouth daily.   ? cyanocobalamin (VITAMIN B-12, RUBRAMIN) 1,000 mcg/mL injection Inject 1 mL into the muscle every 30 days.   ? digoxin (LANOXIN) 125 mcg (0.125 mg) tablet Take one tablet by mouth daily.   ? docusate sodium (COLACE) 50 mg capsule Take 50 mg by mouth twice daily as needed for Constipation.   ? fluticasone (FLONASE) 50 mcg/Actuation nasal spray Apply 1 spray to each nostril as directed at bedtime daily.   ? Guaifenesin 400 mg tab Take 400 mg by mouth twice daily as needed.   ? levothyroxine (SYNTHROID) 75 mcg tablet Take 1 Tab by mouth daily.   ? Lidocaine 4 % ptmd Apply 1 patch topically to affected area daily as needed.   ? metoprolol XL (TOPROL XL) 25 mg extended release tablet Take one tablet by mouth daily.   ? MYRBETRIQ 50 mg tablet Take 1 tablet by mouth daily. (Patient taking differently: Take 25 mg by mouth daily.)   ? other medication Take 1 Dose by mouth daily. prevergen   ? sacubitriL-valsartan (ENTRESTO) 24-26 mg tablet Take one tablet by mouth twice daily. (Patient taking differently: Take 0.5 tablets by mouth twice daily.)   ? saliva, synthetic (MOUTHKOTE) spra Take 2 sprays by mouth or throat as directed as Needed.   ? simethicone (MYLICON) 80 mg chew tablet Chew 80 mg by mouth every 6 hours as needed for Flatulence.   ? spironolactone (ALDACTONE) 25 mg tablet Take one tablet by mouth daily. Take with food.   ? torsemide (DEMADEX) 10 mg tablet Take one tablet by mouth three times weekly.   ? vit A/vit C/vit E/zinc/copper (PRESERVISION AREDS PO) Take 1 tablet by mouth twice daily.   ? warfarin (COUMADIN) 2 mg tablet Take 2 Tabs by mouth daily. 4mg  everyday except Sunday is 2mg  (Patient taking differently: Take  by mouth at bedtime daily. TuThSaSu= 3mg   MWF= 4mg )

## 2020-02-18 ENCOUNTER — Encounter: Admit: 2020-02-18 | Discharge: 2020-02-18 | Payer: MEDICARE

## 2020-02-18 DIAGNOSIS — I428 Other cardiomyopathies: Secondary | ICD-10-CM

## 2020-02-18 DIAGNOSIS — I4821 Permanent atrial fibrillation: Secondary | ICD-10-CM

## 2020-02-18 DIAGNOSIS — I2510 Atherosclerotic heart disease of native coronary artery without angina pectoris: Secondary | ICD-10-CM

## 2020-02-18 DIAGNOSIS — I10 Essential (primary) hypertension: Secondary | ICD-10-CM

## 2020-02-18 LAB — BASIC METABOLIC PANEL
Lab: 1.2
Lab: 105
Lab: 138
Lab: 22
Lab: 24
Lab: 4.9
Lab: 88
Lab: 9.9

## 2020-02-18 LAB — CBC
Lab: 13
Lab: 150
Lab: 39
Lab: 4.1
Lab: 6.5

## 2020-02-18 LAB — BNP (B-TYPE NATRIURETIC PEPTI): Lab: 117

## 2020-02-22 ENCOUNTER — Encounter: Admit: 2020-02-22 | Discharge: 2020-02-22 | Payer: MEDICARE

## 2020-03-03 ENCOUNTER — Encounter: Admit: 2020-03-03 | Discharge: 2020-03-03 | Payer: MEDICARE

## 2020-03-21 ENCOUNTER — Encounter

## 2020-03-21 DIAGNOSIS — I2510 Atherosclerotic heart disease of native coronary artery without angina pectoris: Secondary | ICD-10-CM

## 2020-03-21 DIAGNOSIS — Z9229 Personal history of other drug therapy: Secondary | ICD-10-CM

## 2020-03-21 DIAGNOSIS — N401 Enlarged prostate with lower urinary tract symptoms: Secondary | ICD-10-CM

## 2020-03-21 DIAGNOSIS — E039 Hypothyroidism, unspecified: Secondary | ICD-10-CM

## 2020-03-21 DIAGNOSIS — I428 Other cardiomyopathies: Secondary | ICD-10-CM

## 2020-03-21 DIAGNOSIS — K635 Polyp of colon: Secondary | ICD-10-CM

## 2020-03-21 DIAGNOSIS — R040 Epistaxis: Secondary | ICD-10-CM

## 2020-03-21 DIAGNOSIS — E785 Hyperlipidemia, unspecified: Secondary | ICD-10-CM

## 2020-03-21 DIAGNOSIS — I480 Paroxysmal atrial fibrillation: Secondary | ICD-10-CM

## 2020-03-21 DIAGNOSIS — R32 Unspecified urinary incontinence: Secondary | ICD-10-CM

## 2020-03-21 DIAGNOSIS — K219 Gastro-esophageal reflux disease without esophagitis: Secondary | ICD-10-CM

## 2020-03-21 DIAGNOSIS — T50905A Adverse effect of unspecified drugs, medicaments and biological substances, initial encounter: Secondary | ICD-10-CM

## 2020-03-21 DIAGNOSIS — I219 Acute myocardial infarction, unspecified: Secondary | ICD-10-CM

## 2020-03-21 DIAGNOSIS — N529 Male erectile dysfunction, unspecified: Secondary | ICD-10-CM

## 2020-03-21 DIAGNOSIS — Z9889 Other specified postprocedural states: Secondary | ICD-10-CM

## 2020-03-21 DIAGNOSIS — I10 Essential (primary) hypertension: Secondary | ICD-10-CM

## 2020-03-21 DIAGNOSIS — Z8679 Personal history of other diseases of the circulatory system: Secondary | ICD-10-CM

## 2020-03-21 DIAGNOSIS — K469 Unspecified abdominal hernia without obstruction or gangrene: Secondary | ICD-10-CM

## 2020-03-21 DIAGNOSIS — I340 Nonrheumatic mitral (valve) insufficiency: Secondary | ICD-10-CM

## 2020-03-21 DIAGNOSIS — G4730 Sleep apnea, unspecified: Secondary | ICD-10-CM

## 2020-03-21 DIAGNOSIS — N390 Urinary tract infection, site not specified: Secondary | ICD-10-CM

## 2020-03-21 DIAGNOSIS — M1990 Unspecified osteoarthritis, unspecified site: Secondary | ICD-10-CM

## 2020-03-21 DIAGNOSIS — N400 Enlarged prostate without lower urinary tract symptoms: Secondary | ICD-10-CM

## 2020-03-21 DIAGNOSIS — E162 Hypoglycemia, unspecified: Secondary | ICD-10-CM

## 2020-03-21 DIAGNOSIS — Z789 Other specified health status: Secondary | ICD-10-CM

## 2020-03-21 DIAGNOSIS — Z95810 Presence of automatic (implantable) cardiac defibrillator: Secondary | ICD-10-CM

## 2020-03-21 MED ORDER — SACUBITRIL-VALSARTAN 24-26 MG PO TAB
.5 | Freq: Two times a day (BID) | ORAL | 0 refills | Status: AC
Start: 2020-03-21 — End: ?

## 2020-03-21 NOTE — Patient Instructions
Thank you for visiting our office today.     Please take your medications regularly. Check your list with what you have on hand at home.     We recommend follow-up with our office in 3 months. Our office will contact you to schedule your next appointment.     Please complete the following:     Please schedule the echocardiogram ordered by Dr. Doristine Counter today before leaving. Call [phone] to schedule if unable to schedule today.

## 2020-03-22 ENCOUNTER — Encounter

## 2020-03-22 DIAGNOSIS — N401 Enlarged prostate with lower urinary tract symptoms: Secondary | ICD-10-CM

## 2020-03-22 DIAGNOSIS — M1990 Unspecified osteoarthritis, unspecified site: Secondary | ICD-10-CM

## 2020-03-22 DIAGNOSIS — Z8679 Personal history of other diseases of the circulatory system: Secondary | ICD-10-CM

## 2020-03-22 DIAGNOSIS — I219 Acute myocardial infarction, unspecified: Secondary | ICD-10-CM

## 2020-03-22 DIAGNOSIS — R32 Unspecified urinary incontinence: Secondary | ICD-10-CM

## 2020-03-22 DIAGNOSIS — N400 Enlarged prostate without lower urinary tract symptoms: Secondary | ICD-10-CM

## 2020-03-22 DIAGNOSIS — Z9229 Personal history of other drug therapy: Secondary | ICD-10-CM

## 2020-03-22 DIAGNOSIS — Z9889 Other specified postprocedural states: Secondary | ICD-10-CM

## 2020-03-22 DIAGNOSIS — I10 Essential (primary) hypertension: Secondary | ICD-10-CM

## 2020-03-22 DIAGNOSIS — E162 Hypoglycemia, unspecified: Secondary | ICD-10-CM

## 2020-03-22 DIAGNOSIS — K469 Unspecified abdominal hernia without obstruction or gangrene: Secondary | ICD-10-CM

## 2020-03-22 DIAGNOSIS — I340 Nonrheumatic mitral (valve) insufficiency: Secondary | ICD-10-CM

## 2020-03-22 DIAGNOSIS — E039 Hypothyroidism, unspecified: Secondary | ICD-10-CM

## 2020-03-22 DIAGNOSIS — I480 Paroxysmal atrial fibrillation: Secondary | ICD-10-CM

## 2020-03-22 DIAGNOSIS — R040 Epistaxis: Secondary | ICD-10-CM

## 2020-03-22 DIAGNOSIS — N529 Male erectile dysfunction, unspecified: Secondary | ICD-10-CM

## 2020-03-22 DIAGNOSIS — I428 Other cardiomyopathies: Secondary | ICD-10-CM

## 2020-03-22 DIAGNOSIS — N390 Urinary tract infection, site not specified: Secondary | ICD-10-CM

## 2020-03-22 DIAGNOSIS — E785 Hyperlipidemia, unspecified: Secondary | ICD-10-CM

## 2020-03-22 DIAGNOSIS — Z95810 Presence of automatic (implantable) cardiac defibrillator: Secondary | ICD-10-CM

## 2020-03-22 DIAGNOSIS — G4730 Sleep apnea, unspecified: Secondary | ICD-10-CM

## 2020-03-22 DIAGNOSIS — Z789 Other specified health status: Secondary | ICD-10-CM

## 2020-03-22 DIAGNOSIS — K219 Gastro-esophageal reflux disease without esophagitis: Secondary | ICD-10-CM

## 2020-03-22 DIAGNOSIS — T50905A Adverse effect of unspecified drugs, medicaments and biological substances, initial encounter: Secondary | ICD-10-CM

## 2020-03-22 DIAGNOSIS — K635 Polyp of colon: Secondary | ICD-10-CM

## 2020-03-31 ENCOUNTER — Encounter: Admit: 2020-03-31 | Discharge: 2020-03-31 | Payer: MEDICARE

## 2020-03-31 NOTE — Progress Notes
Joseph Hoover, [birth year] has an appointment with Dr. Wallene Huh on 04/05/20.    Please send recent lab results for continuity of care.    Thank you,   [name]    Phone: [phone]  Fax: [phone]

## 2020-04-03 ENCOUNTER — Encounter: Admit: 2020-04-03 | Discharge: 2020-04-03 | Payer: MEDICARE

## 2020-04-04 ENCOUNTER — Encounter: Admit: 2020-04-04 | Discharge: 2020-04-04 | Payer: MEDICARE

## 2020-04-04 NOTE — Progress Notes
Labs received from PCP were already entered in historical.

## 2020-04-05 ENCOUNTER — Ambulatory Visit: Admit: 2020-04-05 | Discharge: 2020-04-05 | Payer: MEDICARE

## 2020-04-05 ENCOUNTER — Encounter: Admit: 2020-04-05 | Discharge: 2020-04-05 | Payer: MEDICARE

## 2020-04-05 DIAGNOSIS — T50905A Adverse effect of unspecified drugs, medicaments and biological substances, initial encounter: Secondary | ICD-10-CM

## 2020-04-05 DIAGNOSIS — K635 Polyp of colon: Secondary | ICD-10-CM

## 2020-04-05 DIAGNOSIS — N400 Enlarged prostate without lower urinary tract symptoms: Secondary | ICD-10-CM

## 2020-04-05 DIAGNOSIS — R040 Epistaxis: Secondary | ICD-10-CM

## 2020-04-05 DIAGNOSIS — N390 Urinary tract infection, site not specified: Secondary | ICD-10-CM

## 2020-04-05 DIAGNOSIS — G4730 Sleep apnea, unspecified: Secondary | ICD-10-CM

## 2020-04-05 DIAGNOSIS — Z95810 Presence of automatic (implantable) cardiac defibrillator: Secondary | ICD-10-CM

## 2020-04-05 DIAGNOSIS — I10 Essential (primary) hypertension: Secondary | ICD-10-CM

## 2020-04-05 DIAGNOSIS — K219 Gastro-esophageal reflux disease without esophagitis: Secondary | ICD-10-CM

## 2020-04-05 DIAGNOSIS — M1990 Unspecified osteoarthritis, unspecified site: Secondary | ICD-10-CM

## 2020-04-05 DIAGNOSIS — E039 Hypothyroidism, unspecified: Secondary | ICD-10-CM

## 2020-04-05 DIAGNOSIS — Z9229 Personal history of other drug therapy: Secondary | ICD-10-CM

## 2020-04-05 DIAGNOSIS — K469 Unspecified abdominal hernia without obstruction or gangrene: Secondary | ICD-10-CM

## 2020-04-05 DIAGNOSIS — I428 Other cardiomyopathies: Secondary | ICD-10-CM

## 2020-04-05 DIAGNOSIS — Z9889 Other specified postprocedural states: Secondary | ICD-10-CM

## 2020-04-05 DIAGNOSIS — N529 Male erectile dysfunction, unspecified: Secondary | ICD-10-CM

## 2020-04-05 DIAGNOSIS — Z8679 Personal history of other diseases of the circulatory system: Secondary | ICD-10-CM

## 2020-04-05 DIAGNOSIS — Z789 Other specified health status: Secondary | ICD-10-CM

## 2020-04-05 DIAGNOSIS — I219 Acute myocardial infarction, unspecified: Secondary | ICD-10-CM

## 2020-04-05 DIAGNOSIS — R32 Unspecified urinary incontinence: Secondary | ICD-10-CM

## 2020-04-05 DIAGNOSIS — N401 Enlarged prostate with lower urinary tract symptoms: Secondary | ICD-10-CM

## 2020-04-05 DIAGNOSIS — I480 Paroxysmal atrial fibrillation: Secondary | ICD-10-CM

## 2020-04-05 DIAGNOSIS — I340 Nonrheumatic mitral (valve) insufficiency: Secondary | ICD-10-CM

## 2020-04-05 DIAGNOSIS — E785 Hyperlipidemia, unspecified: Secondary | ICD-10-CM

## 2020-04-05 DIAGNOSIS — E162 Hypoglycemia, unspecified: Secondary | ICD-10-CM

## 2020-04-07 ENCOUNTER — Encounter: Admit: 2020-04-07 | Discharge: 2020-04-07 | Payer: MEDICARE

## 2020-04-07 ENCOUNTER — Ambulatory Visit: Admit: 2020-04-07 | Discharge: 2020-04-07 | Payer: MEDICARE

## 2020-04-07 DIAGNOSIS — I2510 Atherosclerotic heart disease of native coronary artery without angina pectoris: Secondary | ICD-10-CM

## 2020-04-07 DIAGNOSIS — I10 Essential (primary) hypertension: Secondary | ICD-10-CM

## 2020-04-07 DIAGNOSIS — I428 Other cardiomyopathies: Secondary | ICD-10-CM

## 2020-04-07 MED ORDER — PERFLUTREN LIPID MICROSPHERES 1.1 MG/ML IV SUSP
1-20 mL | Freq: Once | INTRAVENOUS | 0 refills | Status: CP | PRN
Start: 2020-04-07 — End: ?
  Administered 2020-04-07: 20:00:00 1 mL via INTRAVENOUS

## 2020-04-08 ENCOUNTER — Encounter: Admit: 2020-04-08 | Discharge: 2020-04-08 | Payer: MEDICARE

## 2020-04-08 DIAGNOSIS — I428 Other cardiomyopathies: Secondary | ICD-10-CM

## 2020-04-08 DIAGNOSIS — E785 Hyperlipidemia, unspecified: Secondary | ICD-10-CM

## 2020-04-08 DIAGNOSIS — R040 Epistaxis: Secondary | ICD-10-CM

## 2020-04-08 DIAGNOSIS — G4730 Sleep apnea, unspecified: Secondary | ICD-10-CM

## 2020-04-08 DIAGNOSIS — N529 Male erectile dysfunction, unspecified: Secondary | ICD-10-CM

## 2020-04-08 DIAGNOSIS — Z95810 Presence of automatic (implantable) cardiac defibrillator: Secondary | ICD-10-CM

## 2020-04-08 DIAGNOSIS — Z8679 Personal history of other diseases of the circulatory system: Secondary | ICD-10-CM

## 2020-04-08 DIAGNOSIS — K219 Gastro-esophageal reflux disease without esophagitis: Secondary | ICD-10-CM

## 2020-04-08 DIAGNOSIS — T50905A Adverse effect of unspecified drugs, medicaments and biological substances, initial encounter: Secondary | ICD-10-CM

## 2020-04-08 DIAGNOSIS — I340 Nonrheumatic mitral (valve) insufficiency: Secondary | ICD-10-CM

## 2020-04-08 DIAGNOSIS — M1990 Unspecified osteoarthritis, unspecified site: Secondary | ICD-10-CM

## 2020-04-08 DIAGNOSIS — Z9889 Other specified postprocedural states: Secondary | ICD-10-CM

## 2020-04-08 DIAGNOSIS — E039 Hypothyroidism, unspecified: Secondary | ICD-10-CM

## 2020-04-08 DIAGNOSIS — Z9229 Personal history of other drug therapy: Secondary | ICD-10-CM

## 2020-04-08 DIAGNOSIS — I480 Paroxysmal atrial fibrillation: Secondary | ICD-10-CM

## 2020-04-08 DIAGNOSIS — I10 Essential (primary) hypertension: Secondary | ICD-10-CM

## 2020-04-08 DIAGNOSIS — R32 Unspecified urinary incontinence: Secondary | ICD-10-CM

## 2020-04-08 DIAGNOSIS — E162 Hypoglycemia, unspecified: Secondary | ICD-10-CM

## 2020-04-08 DIAGNOSIS — N400 Enlarged prostate without lower urinary tract symptoms: Secondary | ICD-10-CM

## 2020-04-08 DIAGNOSIS — K635 Polyp of colon: Secondary | ICD-10-CM

## 2020-04-08 DIAGNOSIS — N390 Urinary tract infection, site not specified: Secondary | ICD-10-CM

## 2020-04-08 DIAGNOSIS — N401 Enlarged prostate with lower urinary tract symptoms: Secondary | ICD-10-CM

## 2020-04-08 DIAGNOSIS — I219 Acute myocardial infarction, unspecified: Secondary | ICD-10-CM

## 2020-04-08 DIAGNOSIS — Z789 Other specified health status: Secondary | ICD-10-CM

## 2020-04-08 DIAGNOSIS — K469 Unspecified abdominal hernia without obstruction or gangrene: Secondary | ICD-10-CM

## 2020-04-10 ENCOUNTER — Encounter: Admit: 2020-04-10 | Discharge: 2020-04-10 | Payer: MEDICARE

## 2020-04-10 DIAGNOSIS — Z95810 Presence of automatic (implantable) cardiac defibrillator: Secondary | ICD-10-CM

## 2020-04-10 NOTE — Telephone Encounter
-----   Message from Jen Mow, MD sent at 04/07/2020  6:34 PM CST -----  It may be good idea.  I dont know much about barostim but he will be willing to see you to discuss.    ----- Message -----  From: Janalyn Rouse, MD  Sent: 04/05/2020   9:14 PM CST  To: Jen Mow, MD    He would be a candidate for the CCM device but I am not sure that would be the ideal choice as it would add two more leads through the SVC for a total of five leads.  It may be better to consider him for Barostim.  Would you like me to see him in clinic to discuss?  We would probably want to get a carotid ultrasound and NT proBNP prior to a clinic visit.  ----- Message -----  From: Jen Mow, MD  Sent: 04/05/2020   2:04 PM CST  To: Janalyn Rouse, MD    Can you review the chart and see if he is a candidate for CCM monitoring?  Dr Doristine Counter wants him to consider it. He does not see HF clinic.    Extremely nice guy. I have seen him forever but gave him to [place].  He is CHF FC III  EF 30%  Mechanical MVR  Permanent AF CHB and PVCs.  Pacer dependent  Has CRTD but RA lead and LV lead turned off (non functional epicardial lead and LV lead diaphragmatic pacing.)  His QRS is narrowish even with RV only pacing  ( )    I dont know enough about CCM to see if he would qualify.

## 2020-04-10 NOTE — Telephone Encounter
Called to discuss. Spoke to pts wife. They are agreeable to meet with [facility] and to have carotid US completed prior. Cheryl offered 3/24 at 1545. Will have scheduler coordinate with carotid US.     Went to order carotid US and there were two orders. Called to clarify and they asked if we want bilateral or just one side and if any additional imaging is needed. Will respond to [facility] staff msg. Will wait to msg scheduler until order for carotids is placed.

## 2020-04-19 ENCOUNTER — Encounter: Admit: 2020-04-19 | Discharge: 2020-04-19 | Payer: MEDICARE

## 2020-04-24 ENCOUNTER — Encounter: Admit: 2020-04-24 | Discharge: 2020-04-24 | Payer: MEDICARE

## 2020-04-24 NOTE — Telephone Encounter
Janalyn Rouse, MD  Jen Mow, MD; Cheryll Dessert Nurse Ep Team C  Looks like he canceled his appointment for 04/27/2020. Can we see if that was intentional or not? Thanks.

## 2020-04-24 NOTE — Telephone Encounter
Called and spoke to patients wife.  She reports the OV was canceled intentionally.  She states, "it's not a good time to proceed forth" for personal reasons.  She did not want to discuss further at this time.

## 2020-05-26 ENCOUNTER — Encounter: Admit: 2020-05-26 | Discharge: 2020-05-26 | Payer: MEDICARE

## 2020-05-26 NOTE — Progress Notes
Florian Buff, 04/28/1940 has an appointment with Dr. Doristine Counter on 05/31/20.    Please send recent lab results for continuity of care.    Thank you,   Braiden Rodman    Phone: [phone]  Fax: [phone]

## 2020-05-31 ENCOUNTER — Encounter: Admit: 2020-05-31 | Discharge: 2020-05-31 | Payer: MEDICARE

## 2020-05-31 ENCOUNTER — Ambulatory Visit: Admit: 2020-05-31 | Discharge: 2020-06-01 | Payer: MEDICARE

## 2020-05-31 DIAGNOSIS — I480 Paroxysmal atrial fibrillation: Secondary | ICD-10-CM

## 2020-05-31 DIAGNOSIS — Z952 Presence of prosthetic heart valve: Secondary | ICD-10-CM

## 2020-05-31 DIAGNOSIS — G4733 Obstructive sleep apnea (adult) (pediatric): Secondary | ICD-10-CM

## 2020-05-31 DIAGNOSIS — Z95810 Presence of automatic (implantable) cardiac defibrillator: Secondary | ICD-10-CM

## 2020-05-31 DIAGNOSIS — Z9889 Other specified postprocedural states: Secondary | ICD-10-CM

## 2020-05-31 DIAGNOSIS — Z8679 Personal history of other diseases of the circulatory system: Secondary | ICD-10-CM

## 2020-05-31 DIAGNOSIS — I340 Nonrheumatic mitral (valve) insufficiency: Secondary | ICD-10-CM

## 2020-05-31 DIAGNOSIS — R040 Epistaxis: Secondary | ICD-10-CM

## 2020-05-31 DIAGNOSIS — N390 Urinary tract infection, site not specified: Secondary | ICD-10-CM

## 2020-05-31 DIAGNOSIS — K635 Polyp of colon: Secondary | ICD-10-CM

## 2020-05-31 DIAGNOSIS — Z7901 Long term (current) use of anticoagulants: Secondary | ICD-10-CM

## 2020-05-31 DIAGNOSIS — D7582 Heparin induced thrombocytopenia (HIT): Secondary | ICD-10-CM

## 2020-05-31 DIAGNOSIS — N401 Enlarged prostate with lower urinary tract symptoms: Secondary | ICD-10-CM

## 2020-05-31 DIAGNOSIS — G4730 Sleep apnea, unspecified: Secondary | ICD-10-CM

## 2020-05-31 DIAGNOSIS — I428 Other cardiomyopathies: Secondary | ICD-10-CM

## 2020-05-31 DIAGNOSIS — N529 Male erectile dysfunction, unspecified: Secondary | ICD-10-CM

## 2020-05-31 DIAGNOSIS — M1990 Unspecified osteoarthritis, unspecified site: Secondary | ICD-10-CM

## 2020-05-31 DIAGNOSIS — I219 Acute myocardial infarction, unspecified: Secondary | ICD-10-CM

## 2020-05-31 DIAGNOSIS — I5042 Chronic combined systolic (congestive) and diastolic (congestive) heart failure: Secondary | ICD-10-CM

## 2020-05-31 DIAGNOSIS — T50905A Adverse effect of unspecified drugs, medicaments and biological substances, initial encounter: Secondary | ICD-10-CM

## 2020-05-31 DIAGNOSIS — Z789 Other specified health status: Secondary | ICD-10-CM

## 2020-05-31 DIAGNOSIS — K469 Unspecified abdominal hernia without obstruction or gangrene: Secondary | ICD-10-CM

## 2020-05-31 DIAGNOSIS — E785 Hyperlipidemia, unspecified: Secondary | ICD-10-CM

## 2020-05-31 DIAGNOSIS — I10 Essential (primary) hypertension: Secondary | ICD-10-CM

## 2020-05-31 DIAGNOSIS — K219 Gastro-esophageal reflux disease without esophagitis: Secondary | ICD-10-CM

## 2020-05-31 DIAGNOSIS — Z9229 Personal history of other drug therapy: Secondary | ICD-10-CM

## 2020-05-31 DIAGNOSIS — E039 Hypothyroidism, unspecified: Secondary | ICD-10-CM

## 2020-05-31 DIAGNOSIS — N400 Enlarged prostate without lower urinary tract symptoms: Secondary | ICD-10-CM

## 2020-05-31 DIAGNOSIS — R32 Unspecified urinary incontinence: Secondary | ICD-10-CM

## 2020-05-31 DIAGNOSIS — E162 Hypoglycemia, unspecified: Secondary | ICD-10-CM

## 2020-05-31 DIAGNOSIS — I2510 Atherosclerotic heart disease of native coronary artery without angina pectoris: Secondary | ICD-10-CM

## 2020-05-31 NOTE — Progress Notes
Date of Service: 05/31/2020    Joseph Hoover is a 80 y.o. male.       HPI      Mr. Joseph Hoover is a delightful?80 year old gentleman followed through?the [place] cardiology office?for permanent atrial fibrillation, dilated nonischemic cardiomyopathy, mechanical mitral valve replacement 1998, severe LAE, ICD with ?failed LV leads past and present, recurrent polymorphic VT and PVCs, nonobstructive CAD, and heart failure with reduced ejection fraction. ?An ICD implant in?[place] in?2005 epicardial LV leads were attempted but failed. ?An attempt?to place?a CS lead failed?and LV lead failed?in 2019?due to diaphragmatic pacing. ?He is programmed at [facility] with his ICD currently. ?His atrial fibrillation failed treatment with amiodarone and dofetilide. ?He not a candidate for ablation due to left atrium over 6 cm.??He is not a good candidate for advanced heart failure therapies.??Over the last year we had to decrease his Entresto and diuretic dose due to hypotension.  Jardiance was added recently.  He is recently diagnosed with sleep apnea but has difficulty using his BiPAP.    Mr. Joseph Hoover is back for routine follow-up.  His initial statement was that he was doing better.  He thought his exercise tolerance is better.  He then said he thought possibly is doing worse.  His wife tells me that his exercise tolerance is better, his exercise capacity is better, he can walk around much better now that he is on Jardiance.  He is still limited.  He cannot walk more than 40 feet without feeling shortness of breath but this is an improvement.  He has not had PND orthopnea.  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  He does not go out to the store or walk.    They have not been doing daily weights or bringing the weights or doing the daily blood pressures.  I reviewed all of the instructions for doing so and handed out with after visit summary.  Asked him to wait themselves daily.  Today's weight is 201 pounds.  This may be near his dry weight.  She thinks that that correlates with 297 pounds at home.  We will plan on using this is her dry weight for now until I get back in her CAD.  If he gets above 202 pounds he is to call our office with for instructions to increase the torsemide.  I try to give him an automatic cord of increase the torsemide but I afraid that this will fail.  Also not aware of the sodium and water restriction.  We went over this again giving a 64 fluid balance water restriction and 2 g sodium restriction.  We talked about resources to get this.    ICD interrogation 04/05/2020 shows normal performance of the ICD.  He said 12 short runs of nonsustained ventricular tachycardia which are typical for him.  This is not currently programmed as a BiV device and the LV lead is off.  The battery still has over 6 and half years.  Lead performance for the RV lead is normal.  Since he does not have biventricular pacing I asked him to see Dr. Mardelle Matte to see if he would benefit from His bundle pacing.  Dr. Arlana Pouch did not think he was a candidate.  He did suggest he see Dr. Bernette Mayers to discuss CCM or barostim.  Joseph Hoover is elected not to do this    EKG demonstrates underlying atrial fibrillation, ventricular paced rhythm,  PVCs and some capture beats.  Examination reveals no JVD, no rales, no S3, no HJR, CVP is less than 6  ?  Impression  1. ?Heart failure with reduced ejection fraction. ?Chronic combined systolic/diastolic heart failure. ?Functional class III symptoms as he is worsened.  --Some improvement in symptoms with Jardiance  --Patient continues to have severe impairment function capacity and dyspnea  2.  Dilated/nonischemic cardiomyopathy  3. ?Permanent atrial fibrillation  4. ?ICD functioning normally. ?VVI device with failure of LV pacing.  5. ?Orthostatic hypotension.  6. ?History of hypertension. ?Currently have had reduced Entresto due to hypotension  7. ?Chronic anticoagulant warfarin  8. ?Status post mitral valve replacement with mechanical valve  9. ?Sleep apnea  10. ?Ventricular tachycardia-occasional  11. ?History of hypercholesterolemia  ?  Plan  Continue medical therapy  I spent considerable time going through the fluid restriction and sodium restriction Daily weights and blood pressures that help with heart failure management.  Will continue to follow 44-month intervals  At this point is not interested in another EP evaluation         Vitals:    05/31/20 1354   BP: 118/78   BP Source: Arm, Right Upper   Pulse: 84   PainSc: Zero   Weight: 91.2 kg (201 lb 1.6 oz)   Height: 180.3 cm (5' 11)     Body mass index is 28.05 kg/m?Marland Kitchen     Past Medical History  Patient Active Problem List    Diagnosis Date Noted   ? NICM (nonischemic cardiomyopathy) (HCC) 01/29/2011     Priority: Low     12/2003 cath in [place]: LAD 40% OMB 50% RCA normal  02/10-Echo with EF of 50% and a left atrial size of 5.7 cm    Stress test negative for any significant ischemia, EF 50%  09/13 echo: EF 40% LA 6.7, LV 6.7 mild LVH, mechanical mitral valve well-seated, mean gradient 4, moderate RAE, mild AI  10/13 regaden thallium: EF 37%, LV 156 mL  Small basal inferolateral perfusion defect suspect nonischemic cardiomyopathy  01/15 echo: EF 30-35%, LV 6.7, LA 6.2, RV normal, normal Saint Jude bileaflet mitral valve, trace MR, mod TR, PAP 37  10/15 echo: EF 25%, normal mitral valve mechanical prosthesis, mean grad 3, no MR, no effusion, LV 5.6 LA 4.7, PAP 36  4/18 echo, TDS with PVCs.  EF 25%, LV 6.0, mild LVH, LA 5.9, RV normal, normal mechanical MV, mean grad 4, mild AI  6/19 echo EF 20%, LV 6.6, LA 5.7, RV enlarged with normal function, normal Saint Jude mechanical valve,  mn grad 3, PA P 31  05/20 Echo: EF 25% apical akinesis RVE LAE, normal Saint Jude mechanical mitral valve, PAP 27  10/21 echo: EF 30%, severe global hypokinesis, apical akinesis, RVE with decreased EF, LAE, normal mechanical valve, PAP 29, moderate TR     ? ICD (implantable cardioverter-defibrillator), single, in situ 08/30/2008     Priority: Low     11/2003 [place].  Low EF with atrial fibrillation.  CRT-D placed with Guidant Contak Hg 179, Medtronic 446 A-lead, Guidant 0185 RV lead a Medtronic 4402 transvenous lead.  Leads failed.  Epicardial lead placed  01/06 Three  ICD shocks for atrial fibrillation.  Amiodarone  Started  12/07  Medtronic [id number] dual-chamber ICD programmed to AAIR/DDDR mode.  Both LV leads failed-capped EF improved  01/15 generator change.  New Medtronic Sisters VR [id number] sn [id number] H   a-lead capped  02/19 upgrade ICD to BiV.  Medtronic Clarita MRI Quad CRT-D [id number] sn [id number] H.  New LV lead- Med 4598?88 [id number] V  06/19 excess diaphragmatic pacing from LV lead.  BiV pacing abandon     ? Coronary artery disease involving native coronary artery of native heart without angina pectoris 01/06/2006     Priority: Low     12/2003 cardiac cath in [facility].  EF 35%, LAD 40%, OMB 50%, RCA and LM normal  10/13 regaden thallium: EF 37%, LV 156 mL  Small basal inferolateral perfusion defect suspect nonischemic cardiomyopathy  01/19 regaden thallium: EF 31%, LV 177 mL, fixed inferior fusion defect.  No change, LV larger in defect      ? Chronic combined systolic and diastolic heart failure (HCC) 02/15/2020   ? History of mitral valve replacement 01/20/2018   ? 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 Overactive bladder 02/13/2016     History urgency, frequency, and urge incontinence requiring use of a continence brief. Good emptying with PVR <100 ml.   Mr. Joseph Hoover was counseled on options and would like to try a course of anticholinergic.   Ditropan 10 mg XL daily with refills.      ? Fatigue 05/31/2013   ? BPH with obstruction/lower urinary tract symptoms      2013: Previously on Rapaflo but switched to Tamsulosin per [facility] formulary. D/C'd Tamsulosin d/t dizziness/hypotension ~ Aug 2013.  2014: Seen by Marin Roberts, PA-C. Started on finasteride    02/13/2016: Establishes care with Dr. Zachery Dauer. Continues on Finasteride. AUA SS 24, Bother score 4     ? S/P inguinal herniorrhaphy 04/24/2011   ? Recurrent right inguinal hernia 01/20/2011   ? Warfarin anticoagulation 01/20/2011     [facility] manages     ? History of ventricular tachycardia/nonsustained 08/31/2008   ? Hypoglycemia 08/31/2008   ? Sleep apnea 08/31/2008   ? Non-rheumatic mitral regurgitation 08/31/2008     St Jude mechanical MVR 1998 Dr. Darra Lis.     ? Paroxysmal atrial fibrillation (HCC) 08/30/2008     a.  Initial presentation in [place] 10/05.  At that time, his EF was 35% (although all prior                    and subsequent EF at [facility] (through 2006) showed an EF of 50% or greater).  They                 placed a CRT ICD (he is hooked up to an epicardial LV lead).  They had planned to                    ablate his node but did not. His QRS was relatively narrow (per LDB: this strategy                    seems to make little sense).              b.  10/05=>Spring 2007 - No AF on amiodarone 200 mg daily. Amiodarone was held for          two  months because of a questionable rash (it turned out just to be dry skin). At the                     end of two months he had three 10 hour episodes of AF. Amiodarone was reinstituted        with no additional AF over the next six months.  c.  C.  Status post amiodarone discontinuation and Tikosyn initiation in January 2009.     ? 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  01/22/04 -  HIT in [place], details unknown.     ? Cholecystitis 08/30/2008     a.  11/2004 - cholecystectomy and hernia repair.     ? Tobacco abuse 08/30/2008   ? Essential hypertension 01/06/2006   ? Dyslipidemia 01/06/2006   ? Hypothyroidism 01/06/2006     a.  on replacement for many years, levothyroxine Daily           Review of Systems   Constitutional: Negative.   HENT: Negative.    Eyes: Negative.    Cardiovascular: Positive for dyspnea on exertion.   Respiratory: Negative.    Endocrine: Negative.    Hematologic/Lymphatic: Negative.    Skin: Negative.    Gastrointestinal: Negative.    Genitourinary: Negative.    Neurological: Negative.    Psychiatric/Behavioral: Negative.    Allergic/Immunologic: Negative.        Physical Exam   General Appearance:?no acute distress, hands and feet cool  Skin:?warm, moist, rash  HEENT:?unremarkable  Neck Veins:?neck veins are mildly distended. ?CVP 10, no HJR  Carotid Arteries:?normal carotid upstroke bilaterally, no bruits  Chest Inspection:?chest is normal in appearance   Auscultation/Percussion:?lungs clear to auscultation, no rales, rhonchi, or wheezing  Cardiac Rhythm:?regular rhythm and normal rate  Cardiac Auscultation:?Normal S1 &?S2, no S3, no rub, normal prosthetic clicks  Extremities:?Trace lower extremity edema; 2+ symmetric distal pulses  Abdominal Exam:?soft, non-tender, no masses, bowel sounds normal  Liver & Spleen:?no organomegaly  Neurologic Exam:?neurological assessment grossly intact  ?    Cardiovascular Health Factors  Vitals BP Readings from Last 3 Encounters:   05/31/20 118/78   04/07/20 92/68   04/05/20 92/68     Wt Readings from Last 3 Encounters:   05/31/20 91.2 kg (201 lb 1.6 oz)   04/07/20 91.6 kg (202 lb)   04/05/20 91.6 kg (202 lb)     BMI Readings from Last 3 Encounters:   05/31/20 28.05 kg/m?   04/07/20 28.17 kg/m?   04/05/20 28.17 kg/m?      Smoking Social History     Tobacco Use   Smoking Status Former Smoker   ? Packs/day: 1.50   ? Years: 20.00   ? Pack years: 30.00   ? Types: Cigarettes   ? Quit date: 02/04/1982   ? Years since quitting: 38.3   Smokeless Tobacco Never Used      Lipid Profile Cholesterol   Date Value Ref Range Status   05/29/2018 216 (H) <200 Final     HDL   Date Value Ref Range Status   05/29/2018 28 (L) >40 Final     LDL   Date Value Ref Range Status   05/29/2018 148 (H) <100 Final     Triglycerides   Date Value Ref Range Status   05/29/2018 198 (H) <150 Final      Blood Sugar No results found for: HGBA1C  Glucose   Date Value Ref Range Status   02/18/2020 88  Final   02/18/2020 88  Final   12/06/2019 96  Final   09/24/2005 106 70 - 110 MG/DL Final   16/11/9602 540 (H) 70 - 110 MG/DL Final   98/12/9145 829 (H) 70 - 110 MG/DL Final     Glucose, POC   Date Value Ref Range Status   10/01/2005 124 (H) 70 - 110 MG/DL Final  10/01/2005 112 (H) 70 - 110 MG/DL Final   16/11/9602 540 (H) 70 - 110 MG/DL Final          Current Medications (including today's revisions)  ? acetaminophen (TYLENOL) 500 mg tablet Take 1,000 mg by mouth at bedtime as needed.   ? albuterol (PROVENTIL HFA, VENTOLIN HFA) 90 mcg/Actuation IN inhaler Inhale 1 puff by mouth into the lungs every 6 hours as needed for Wheezing.   ? artificial tears(hypromellose) 0.4 % ophthalmic solution Place 1 drop into or around eye(s) five times daily as needed.   ? aspirin EC 81 mg tablet Take 81 mg by mouth daily.   ? calcium carbonate (TUMS) 500 mg (200 mg elemental calcium) chewable tablet Chew 1 tablet by mouth daily as needed.   ? cetirizine (ZYRTEC) 10 mg tablet Take 10 mg by mouth every morning.   ? cholecalciferol (VITAMIN D-3) 1,000 units tablet Take 1,000 Units by mouth daily.   ? coQ10 (ubiquinol) 100 mg cap Take 1 capsule by mouth daily.   ? cyanocobalamin (VITAMIN B-12, RUBRAMIN) 1,000 mcg/mL injection Inject 1 mL into the muscle every 30 days.   ? digoxin (LANOXIN) 125 mcg (0.125 mg) tablet Take one tablet by mouth daily.   ? docusate sodium (COLACE) 50 mg capsule Take 50 mg by mouth twice daily as needed for Constipation.   ? empagliflozin (JARDIANCE) 10 mg tablet Take one tablet by mouth daily. Indications: heart failure with reduced ejection fraction   ? fluticasone (FLONASE) 50 mcg/Actuation nasal spray Apply 1 spray to each nostril as directed at bedtime daily.   ? Guaifenesin 400 mg tab Take 400 mg by mouth twice daily as needed.   ? levothyroxine (SYNTHROID) 75 mcg tablet Take 1 Tab by mouth daily.   ? Lidocaine 4 % ptmd Apply 1 patch topically to affected area daily as needed.   ? metoprolol XL (TOPROL XL) 25 mg extended release tablet Take one tablet by mouth daily.   ? MYRBETRIQ 50 mg tablet Take 1 tablet by mouth daily.   ? sacubitriL-valsartan (ENTRESTO) 24-26 mg tablet Take one-half tablet by mouth twice daily.   ? saliva, synthetic (MOUTHKOTE) spra Take 2 sprays by mouth or throat as directed as Needed.   ? simethicone (MYLICON) 80 mg chew tablet Chew 80 mg by mouth every 6 hours as needed for Flatulence.   ? spironolactone (ALDACTONE) 25 mg tablet Take one tablet by mouth daily. Take with food.   ? torsemide (DEMADEX) 10 mg tablet Take one tablet by mouth three times weekly.   ? vit A/vit C/vit E/zinc/copper (PRESERVISION AREDS PO) Take 1 tablet by mouth twice daily.   ? warfarin (COUMADIN) 2 mg tablet Take 2 Tabs by mouth daily. 4mg  everyday except Sunday is 2mg  (Patient taking differently: Take  by mouth at bedtime daily. TuThSaSu= 3mg   MWF= 4mg )

## 2020-05-31 NOTE — Patient Instructions
Your instructions today:      1.  Medications: no change     2.  Labs:     3.  Next follow up appointment: 2 months     4.  Call for any worsening symptoms of shortness of breath, swelling, sudden weight gain, lightheadedness, heart racing or chest pain.    5.  Weigh yourself  daily and call for weight increase greater 5 pounds above dry weight    You dry weight is 197 lb  Call for weight above 202 lb    6.  Check your blood pressure daily and bring your readings in to each visit for [place] to review. Sit and relax for 10 minute before checking your blood pressure.  Check your blood pressure at least 1-2 hours after you take you medicine(s).   Contact our office if you have more than a few readings above 140 or below 100 systolic mmHg (top number).     7.  Taking your medications as directed helps keep you out of the hospital:  please call if you have concerns about the side effects, cost or refills.    8.  Follow low sodium dietary restriction- no more than 2000 mg daily.      9.  Limit fluids to 2 liters (64 ounces ) a day.      IF YOU HAVE PROBLEMS, QUESTIONS OR CONCERNS CALL THE [facility]URSES AT [phone].

## 2020-06-05 ENCOUNTER — Encounter: Admit: 2020-06-05 | Discharge: 2020-06-05 | Payer: MEDICARE

## 2020-07-24 ENCOUNTER — Encounter: Admit: 2020-07-24 | Discharge: 2020-07-24 | Payer: MEDICARE

## 2020-07-24 DIAGNOSIS — I10 Essential (primary) hypertension: Secondary | ICD-10-CM

## 2020-07-24 DIAGNOSIS — I428 Other cardiomyopathies: Secondary | ICD-10-CM

## 2020-07-24 DIAGNOSIS — N529 Male erectile dysfunction, unspecified: Secondary | ICD-10-CM

## 2020-07-24 DIAGNOSIS — E785 Hyperlipidemia, unspecified: Secondary | ICD-10-CM

## 2020-07-24 DIAGNOSIS — M1990 Unspecified osteoarthritis, unspecified site: Secondary | ICD-10-CM

## 2020-07-24 DIAGNOSIS — N400 Enlarged prostate without lower urinary tract symptoms: Secondary | ICD-10-CM

## 2020-07-24 DIAGNOSIS — Z9229 Personal history of other drug therapy: Secondary | ICD-10-CM

## 2020-07-24 DIAGNOSIS — I480 Paroxysmal atrial fibrillation: Secondary | ICD-10-CM

## 2020-07-24 DIAGNOSIS — K469 Unspecified abdominal hernia without obstruction or gangrene: Secondary | ICD-10-CM

## 2020-07-24 DIAGNOSIS — N390 Urinary tract infection, site not specified: Secondary | ICD-10-CM

## 2020-07-24 DIAGNOSIS — K219 Gastro-esophageal reflux disease without esophagitis: Secondary | ICD-10-CM

## 2020-07-24 DIAGNOSIS — R040 Epistaxis: Secondary | ICD-10-CM

## 2020-07-24 DIAGNOSIS — I340 Nonrheumatic mitral (valve) insufficiency: Secondary | ICD-10-CM

## 2020-07-24 DIAGNOSIS — Z95810 Presence of automatic (implantable) cardiac defibrillator: Secondary | ICD-10-CM

## 2020-07-24 DIAGNOSIS — Z8679 Personal history of other diseases of the circulatory system: Secondary | ICD-10-CM

## 2020-07-24 DIAGNOSIS — E039 Hypothyroidism, unspecified: Secondary | ICD-10-CM

## 2020-07-24 DIAGNOSIS — T50905A Adverse effect of unspecified drugs, medicaments and biological substances, initial encounter: Secondary | ICD-10-CM

## 2020-07-24 DIAGNOSIS — Z789 Other specified health status: Secondary | ICD-10-CM

## 2020-07-24 DIAGNOSIS — G4733 Obstructive sleep apnea (adult) (pediatric): Secondary | ICD-10-CM

## 2020-07-24 DIAGNOSIS — I5042 Chronic combined systolic (congestive) and diastolic (congestive) heart failure: Secondary | ICD-10-CM

## 2020-07-24 DIAGNOSIS — N401 Enlarged prostate with lower urinary tract symptoms: Secondary | ICD-10-CM

## 2020-07-24 DIAGNOSIS — G4730 Sleep apnea, unspecified: Secondary | ICD-10-CM

## 2020-07-24 DIAGNOSIS — R32 Unspecified urinary incontinence: Secondary | ICD-10-CM

## 2020-07-24 DIAGNOSIS — E162 Hypoglycemia, unspecified: Secondary | ICD-10-CM

## 2020-07-24 DIAGNOSIS — I2510 Atherosclerotic heart disease of native coronary artery without angina pectoris: Secondary | ICD-10-CM

## 2020-07-24 DIAGNOSIS — Z9889 Other specified postprocedural states: Secondary | ICD-10-CM

## 2020-07-24 DIAGNOSIS — K635 Polyp of colon: Secondary | ICD-10-CM

## 2020-07-24 DIAGNOSIS — I219 Acute myocardial infarction, unspecified: Secondary | ICD-10-CM

## 2020-07-24 NOTE — Progress Notes
Date of Service: 07/24/2020    Joseph Hoover is a 80 y.o. male.       HPI     Joseph Hoover is a delightful?80 year old gentleman followed through?the [place] cardiology office?for permanent atrial fibrillation, dilated nonischemic cardiomyopathy, mechanical mitral valve replacement 1998, severe LAE, ICD with ?failed LV leads past and present, recurrent polymorphic VT and PVCs, nonobstructive CAD, and heart failure with reduced ejection fraction. ?An ICD implant in?[place] in?2005 epicardial LV leads were attempted but failed. ?An attempt?to place?a CS lead failed?and LV lead failed?in 2019?due to diaphragmatic pacing. ?He is programmed at [facility] with his ICD currently. ?His atrial fibrillation failed treatment with amiodarone and dofetilide. ?He not a candidate for ablation due to left atrium over 6 cm.??He is not a good candidate for advanced heart failure therapies.??Over the last year we had to decrease his Entresto and diuretic dose due to hypotension.??He is on [place].    Joseph Hoover is back for routine follow-up.  His wife has been checking his weights at home.  At her last visit his home weight was 193.8.  He gradually came down to 189 today is 190.  I believe his dry weight at home is going to be 189.  This is 4 pounds below the weight his last office visit.  In our office today his weight is down 4 pounds to 198 at rest.  Blood pressures have been good.  Auto is getting a bit confused.  He believes that his shortness of breath started after we began the last medication.  I reminded him that has been short of breath for years.  His functional past actually sounds like maybe it bit better than it was before we started the Jardiance.  He can barely walk around the house.  He can get around [place] as long as he has a cart to hold on transfer is slow.  He has not had syncope or near syncope or lightheadedness.  He does not have chest pain or exertional chest pain.    His last ICD interrogation 06/05/2020 shows 6 years left on the battery.  Has a few brief episodes of atrial fibrillation.  Into very short episodes of ventricular tachycardia.  Ventricular pacing 75%.    EKG shows underlying atrial fibrillation alternating with ventricular paced rhythm and some fusion beats.  There is no atrial activity seen.  Examination shows no JVD chest is clear heart is regular no S3 or S4    Impression  1. ?Heart failure with reduced ejection fraction. ?Chronic combined systolic/diastolic heart failure. ?Functional class III symptoms as he is worsened.  --Some improvement in symptoms with Jardiance  --Patient continues to have severe impairment function capacity and dyspnea  2. ?Dilated/nonischemic cardiomyopathy  3. ?Permanent atrial fibrillation  4. ?ICD functioning normally. ?VVI device with failure of LV pacing.  5. ?Orthostatic hypotension.  6. ?History of hypertension. ?Currently have had reduced Entresto due to hypotension  7. ?Chronic anticoagulant warfarin  8. ?Status post mitral valve replacement with mechanical valve  9. ?Sleep apnea-treated although he struggles with CPAP  10. ?Ventricular tachycardia-occasional  11. ?History of hypercholesterolemia  ?  Plan  1.  Continue current medical therapy including Jardiance  2.  Continue with daily weights and fluid restriction.  3.  We will continue to follow at 56-month intervals.  He will see APP next in early September         Vitals:    07/24/20 1305   BP: 96/58   BP  Source: Arm, Left Upper   Pulse: 88   SpO2: 98%   O2 Device: None (Room air)   PainSc: Zero   Weight: 90.1 kg (198 lb 9.6 oz)   Height: 180.3 cm (5' 11)     Body mass index is 27.7 kg/m?Marland Kitchen     Past Medical History  Patient Active Problem List    Diagnosis Date Noted   ? Chronic combined systolic and diastolic heart failure (HCC) 02/15/2020   ? History of mitral valve replacement 01/20/2018   ? 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 Overactive bladder 02/13/2016     History urgency, frequency, and urge incontinence requiring use of a continence brief. Good emptying with PVR <100 ml.   Joseph Hoover was counseled on options and would like to try a course of anticholinergic.   Ditropan 10 mg XL daily with refills.      ? Fatigue 05/31/2013   ? BPH with obstruction/lower urinary tract symptoms      2013: Previously on Rapaflo but switched to Tamsulosin per [facility] formulary. D/C'd Tamsulosin d/t dizziness/hypotension ~ Aug 2013.  2014: Seen by Joseph Roberts, PA-C. Started on finasteride    02/13/2016: Establishes care with Dr. Zachery Hoover. Continues on Finasteride. AUA SS 24, Bother score 4     ? S/P inguinal herniorrhaphy 04/24/2011   ? NICM (nonischemic cardiomyopathy) (HCC) 01/29/2011     12/2003 cath in [place]: LAD 40% OMB 50% RCA normal  02/10-Echo with EF of 50% and a left atrial size of 5.7 cm    Stress test negative for any significant ischemia, EF 50%  09/13 echo: EF 40% LA 6.7, LV 6.7 mild LVH, mechanical mitral valve well-seated, mean gradient 4, moderate RAE, mild AI  10/13 regaden thallium: EF 37%, LV 156 mL  Small basal inferolateral perfusion defect suspect nonischemic cardiomyopathy  01/15 echo: EF 30-35%, LV 6.7, LA 6.2, RV normal, normal Saint Jude bileaflet mitral valve, trace MR, mod TR, PAP 37  10/15 echo: EF 25%, normal mitral valve mechanical prosthesis, mean grad 3, no MR, no effusion, LV 5.6 LA 4.7, PAP 36  4/18 echo, TDS with PVCs.  EF 25%, LV 6.0, mild LVH, LA 5.9, RV normal, normal mechanical MV, mean grad 4, mild AI  6/19 echo EF 20%, LV 6.6, LA 5.7, RV enlarged with normal function, normal Saint Jude mechanical valve,  mn grad 3, PA P 31  05/20 Echo: EF 25% apical akinesis RVE LAE, normal Saint Jude mechanical mitral valve, PAP 27  10/21 echo: EF 30%, severe global hypokinesis, apical akinesis, RVE with decreased EF, LAE, normal mechanical valve, PAP 29, moderate TR     ? Recurrent right inguinal hernia 01/20/2011   ? Warfarin anticoagulation 01/20/2011     [facility] manages     ? History of ventricular tachycardia/nonsustained 08/31/2008   ? Hypoglycemia 08/31/2008   ? Obstructive sleep apnea syndrome 08/31/2008   ? Non-rheumatic mitral regurgitation 08/31/2008     St Jude mechanical MVR 1998 Dr. Darra Lis.     ? Paroxysmal atrial fibrillation (HCC) 08/30/2008     a.  Initial presentation in [place] 10/05.  At that time, his EF was 35% (although all prior                    and subsequent EF at [facility] (through 2006) showed an EF of 50% or greater).  They                 placed a CRT  ICD (he is hooked up to an epicardial LV lead).  They had planned to                    ablate his node but did not. His QRS was relatively narrow (per LDB: this strategy                    seems to make little sense).              b.  10/05=>Spring 2007 - No AF on amiodarone 200 mg daily. Amiodarone was held for          two  months because of a questionable rash (it turned out just to be dry skin). At the                     end of two months he had three 10 hour episodes of AF. Amiodarone was reinstituted        with no additional AF over the next six months.                 c.  C.  Status post amiodarone discontinuation and Tikosyn initiation in January 2009.     ? ICD (implantable cardioverter-defibrillator), single, in situ 08/30/2008     11/2003 [place].  Low EF with atrial fibrillation.  CRT-D placed with Guidant Contak Hg 179, Medtronic 446 A-lead, Guidant 0185 RV lead a Medtronic 4402 transvenous lead.  Leads failed.  Epicardial lead placed  01/06 Three  ICD shocks for atrial fibrillation.  Amiodarone  Started  12/07  Medtronic [id number] dual-chamber ICD programmed to AAIR/DDDR mode.  Both LV leads failed-capped EF improved  01/15 generator change.  New Medtronic [place] VR [id number] sn [id number] H   a-lead capped  02/19 upgrade ICD to BiV.  Medtronic [place] MRI Quad CRT-D [id number] sn [id number] H.  New LV lead- Med [id number] V  06/19 excess diaphragmatic pacing from LV lead.  BiV pacing abandon     ? 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  01/22/04 -  HIT in [place], details unknown.     ? Cholecystitis 08/30/2008     a.  11/2004 - cholecystectomy and hernia repair.     ? Tobacco abuse 08/30/2008   ? Coronary artery disease involving native coronary artery of native heart without angina pectoris 01/06/2006     12/2003 cardiac cath in [facility].  EF 35%, LAD 40%, OMB 50%, RCA and LM normal  10/13 regaden thallium: EF 37%, LV 156 mL  Small basal inferolateral perfusion defect suspect nonischemic cardiomyopathy  01/19 regaden thallium: EF 31%, LV 177 mL, fixed inferior fusion defect.  No change, LV larger in defect      ? Essential hypertension 01/06/2006   ? Dyslipidemia 01/06/2006   ? Hypothyroidism 01/06/2006     a.  on replacement for many years, levothyroxine Daily           Review of Systems   Constitutional: Positive for decreased appetite.   HENT: Negative.    Eyes: Negative.    Cardiovascular: Negative.    Respiratory: Positive for shortness of breath.    Endocrine: Negative.    Hematologic/Lymphatic: Negative.    Skin: Negative.    Musculoskeletal: Negative.    Gastrointestinal: Negative.    Genitourinary: Negative.    Neurological: Negative.    Psychiatric/Behavioral: Negative.    Allergic/Immunologic: Negative.        Physical Exam  General Appearance:?no acute distress, hands and feet cool  Skin:?warm, moist, rash  HEENT:?unremarkable  Neck Veins:?neck veins are mildly distended. ?CVP 10,?no HJR  Carotid Arteries:?normal carotid upstroke bilaterally, no bruits  Chest Inspection:?chest is normal in appearance   Auscultation/Percussion:?lungs clear to auscultation, no rales, rhonchi, or wheezing  Cardiac Rhythm:?regular rhythm and normal rate  Cardiac Auscultation:?Normal S1 &?S2, no S3, no rub, normal prosthetic clicks  Extremities:?Trace lower extremity edema; 2+ symmetric distal pulses  Abdominal Exam:?soft, non-tender, no masses, bowel sounds normal  Liver & Spleen:?no organomegaly  Neurologic Exam:?neurological assessment grossly intact  ?    Cardiovascular Health Factors  Vitals BP Readings from Last 3 Encounters:   07/24/20 96/58   05/31/20 118/78   04/07/20 92/68     Wt Readings from Last 3 Encounters:   07/24/20 90.1 kg (198 lb 9.6 oz)   05/31/20 91.2 kg (201 lb 1.6 oz)   04/07/20 91.6 kg (202 lb)     BMI Readings from Last 3 Encounters:   07/24/20 27.70 kg/m?   05/31/20 28.05 kg/m?   04/07/20 28.17 kg/m?      Smoking Social History     Tobacco Use   Smoking Status Former Smoker   ? Packs/day: 1.50   ? Years: 20.00   ? Pack years: 30.00   ? Types: Cigarettes   ? Quit date: 02/04/1982   ? Years since quitting: 38.4   Smokeless Tobacco Never Used      Lipid Profile Cholesterol   Date Value Ref Range Status   05/29/2018 216 (H) <200 Final     HDL   Date Value Ref Range Status   05/29/2018 28 (L) >40 Final     LDL   Date Value Ref Range Status   05/29/2018 148 (H) <100 Final     Triglycerides   Date Value Ref Range Status   05/29/2018 198 (H) <150 Final      Blood Sugar No results found for: HGBA1C  Glucose   Date Value Ref Range Status   02/18/2020 88  Final   02/18/2020 88  Final   12/06/2019 96  Final   09/24/2005 106 70 - 110 MG/DL Final   16/11/9602 540 (H) 70 - 110 MG/DL Final   98/12/9145 829 (H) 70 - 110 MG/DL Final     Glucose, POC   Date Value Ref Range Status   10/01/2005 124 (H) 70 - 110 MG/DL Final   56/21/3086 578 (H) 70 - 110 MG/DL Final   46/96/2952 841 (H) 70 - 110 MG/DL Final          Current Medications (including today's revisions)  ? acetaminophen (TYLENOL) 500 mg tablet Take 1,000 mg by mouth at bedtime as needed.   ? albuterol (PROVENTIL HFA, VENTOLIN HFA) 90 mcg/Actuation IN inhaler Inhale 1 puff by mouth into the lungs every 6 hours as needed for Wheezing.   ? artificial tears(hypromellose) 0.4 % ophthalmic solution Place 1 drop into or around eye(s) five times daily as needed.   ? aspirin EC 81 mg tablet Take 81 mg by mouth daily.   ? calcium carbonate (TUMS) 500 mg (200 mg elemental calcium) chewable tablet Chew 1 tablet by mouth daily as needed.   ? cetirizine (ZYRTEC) 10 mg tablet Take 10 mg by mouth every morning.   ? cholecalciferol (VITAMIN D-3) 1,000 units tablet Take 1,000 Units by mouth daily.   ? coQ10 (ubiquinol) 100 mg cap Take 1 capsule by mouth daily.   ? cyanocobalamin (VITAMIN B-12,  RUBRAMIN) 1,000 mcg/mL injection Inject 1 mL into the muscle every 30 days.   ? digoxin (LANOXIN) 125 mcg (0.125 mg) tablet Take one tablet by mouth daily.   ? docusate sodium (COLACE) 50 mg capsule Take 50 mg by mouth twice daily as needed for Constipation.   ? empagliflozin (JARDIANCE) 10 mg tablet Take one tablet by mouth daily. Indications: heart failure with reduced ejection fraction   ? fluticasone (FLONASE) 50 mcg/Actuation nasal spray Apply 1 spray to each nostril as directed at bedtime daily.   ? Guaifenesin 400 mg tab Take 400 mg by mouth twice daily as needed.   ? levothyroxine (SYNTHROID) 75 mcg tablet Take 1 Tab by mouth daily.   ? Lidocaine 4 % ptmd Apply 1 patch topically to affected area daily as needed.   ? metoprolol XL (TOPROL XL) 25 mg extended release tablet Take one tablet by mouth daily.   ? MYRBETRIQ 50 mg tablet Take 1 tablet by mouth daily.   ? sacubitriL-valsartan (ENTRESTO) 24-26 mg tablet Take one-half tablet by mouth twice daily. (Patient taking differently: Take 0.5 tablets by mouth twice daily. takes half of 49-51 mg twice daily)   ? saliva, synthetic (MOUTHKOTE) spra Take 2 sprays by mouth or throat as directed as Needed.   ? simethicone (MYLICON) 80 mg chew tablet Chew 80 mg by mouth every 6 hours as needed for Flatulence.   ? spironolactone (ALDACTONE) 25 mg tablet Take one tablet by mouth daily. Take with food.   ? torsemide (DEMADEX) 10 mg tablet Take one tablet by mouth three times weekly.   ? vit A/vit C/vit E/zinc/copper (PRESERVISION AREDS PO) Take 1 tablet by mouth twice daily.   ? warfarin (COUMADIN) 2 mg tablet Take 2 Tabs by mouth daily. 4mg  everyday except Sunday is 2mg  (Patient taking differently: Take  by mouth at bedtime daily. TuThSaSu= 3mg   MWF= 4mg )

## 2020-09-01 ENCOUNTER — Encounter: Admit: 2020-09-01 | Discharge: 2020-09-01 | Payer: MEDICARE

## 2020-10-18 ENCOUNTER — Encounter: Admit: 2020-10-18 | Discharge: 2020-10-18 | Payer: MEDICARE

## 2020-10-18 ENCOUNTER — Ambulatory Visit: Admit: 2020-10-18 | Discharge: 2020-10-18 | Payer: MEDICARE

## 2020-10-18 DIAGNOSIS — I5042 Chronic combined systolic (congestive) and diastolic (congestive) heart failure: Secondary | ICD-10-CM

## 2020-10-18 MED ORDER — PERFLUTREN LIPID MICROSPHERES 1.1 MG/ML IV SUSP
1-10 mL | Freq: Once | INTRAVENOUS | 0 refills | Status: CP | PRN
Start: 2020-10-18 — End: ?

## 2020-10-20 ENCOUNTER — Encounter: Admit: 2020-10-20 | Discharge: 2020-10-20 | Payer: MEDICARE

## 2020-10-20 DIAGNOSIS — M1990 Unspecified osteoarthritis, unspecified site: Secondary | ICD-10-CM

## 2020-10-20 DIAGNOSIS — T50905A Adverse effect of unspecified drugs, medicaments and biological substances, initial encounter: Secondary | ICD-10-CM

## 2020-10-20 DIAGNOSIS — G4733 Obstructive sleep apnea (adult) (pediatric): Secondary | ICD-10-CM

## 2020-10-20 DIAGNOSIS — N400 Enlarged prostate without lower urinary tract symptoms: Secondary | ICD-10-CM

## 2020-10-20 DIAGNOSIS — I10 Essential (primary) hypertension: Secondary | ICD-10-CM

## 2020-10-20 DIAGNOSIS — I428 Other cardiomyopathies: Secondary | ICD-10-CM

## 2020-10-20 DIAGNOSIS — Z7901 Long term (current) use of anticoagulants: Secondary | ICD-10-CM

## 2020-10-20 DIAGNOSIS — Z789 Other specified health status: Secondary | ICD-10-CM

## 2020-10-20 DIAGNOSIS — G4730 Sleep apnea, unspecified: Secondary | ICD-10-CM

## 2020-10-20 DIAGNOSIS — I480 Paroxysmal atrial fibrillation: Secondary | ICD-10-CM

## 2020-10-20 DIAGNOSIS — E785 Hyperlipidemia, unspecified: Secondary | ICD-10-CM

## 2020-10-20 DIAGNOSIS — K219 Gastro-esophageal reflux disease without esophagitis: Secondary | ICD-10-CM

## 2020-10-20 DIAGNOSIS — I219 Acute myocardial infarction, unspecified: Secondary | ICD-10-CM

## 2020-10-20 DIAGNOSIS — N529 Male erectile dysfunction, unspecified: Secondary | ICD-10-CM

## 2020-10-20 DIAGNOSIS — Z95810 Presence of automatic (implantable) cardiac defibrillator: Secondary | ICD-10-CM

## 2020-10-20 DIAGNOSIS — I340 Nonrheumatic mitral (valve) insufficiency: Secondary | ICD-10-CM

## 2020-10-20 DIAGNOSIS — K635 Polyp of colon: Secondary | ICD-10-CM

## 2020-10-20 DIAGNOSIS — I5042 Chronic combined systolic (congestive) and diastolic (congestive) heart failure: Secondary | ICD-10-CM

## 2020-10-20 DIAGNOSIS — N401 Enlarged prostate with lower urinary tract symptoms: Secondary | ICD-10-CM

## 2020-10-20 DIAGNOSIS — R040 Epistaxis: Secondary | ICD-10-CM

## 2020-10-20 DIAGNOSIS — N390 Urinary tract infection, site not specified: Secondary | ICD-10-CM

## 2020-10-20 DIAGNOSIS — Z9229 Personal history of other drug therapy: Secondary | ICD-10-CM

## 2020-10-20 DIAGNOSIS — R32 Unspecified urinary incontinence: Secondary | ICD-10-CM

## 2020-10-20 DIAGNOSIS — I2510 Atherosclerotic heart disease of native coronary artery without angina pectoris: Secondary | ICD-10-CM

## 2020-10-20 DIAGNOSIS — Z9889 Other specified postprocedural states: Secondary | ICD-10-CM

## 2020-10-20 DIAGNOSIS — Z8679 Personal history of other diseases of the circulatory system: Secondary | ICD-10-CM

## 2020-10-20 DIAGNOSIS — E039 Hypothyroidism, unspecified: Secondary | ICD-10-CM

## 2020-10-20 DIAGNOSIS — E162 Hypoglycemia, unspecified: Secondary | ICD-10-CM

## 2020-10-20 DIAGNOSIS — K469 Unspecified abdominal hernia without obstruction or gangrene: Secondary | ICD-10-CM

## 2020-10-20 MED ORDER — METOPROLOL SUCCINATE 25 MG PO TB24
12.5 mg | ORAL_TABLET | Freq: Every day | ORAL | 3 refills | 90.00000 days | Status: AC
Start: 2020-10-20 — End: ?

## 2020-10-24 ENCOUNTER — Encounter: Admit: 2020-10-24 | Discharge: 2020-10-24 | Payer: MEDICARE

## 2020-10-24 NOTE — Progress Notes
Records Request- [id number]    Joseph Hoover DOB 23-Jul-1940 [id number]    Medical records request for continuation of care:    Patient has appointment on NOW   with  Janne Lab, APRN.    Please fax records to Cardiovascular [facility] [phone]    Request records:        Recent Labs      Thank you,      Cardiovascular Medicine  [facility]  [address]  [place], [place] [address]  Phone:  [phone]  Fax:  [phone]

## 2020-10-25 ENCOUNTER — Encounter: Admit: 2020-10-25 | Discharge: 2020-10-25 | Payer: MEDICARE

## 2020-11-10 ENCOUNTER — Encounter: Admit: 2020-11-10 | Discharge: 2020-11-10 | Payer: MEDICARE

## 2020-11-30 ENCOUNTER — Encounter: Admit: 2020-11-30 | Discharge: 2020-11-30 | Payer: MEDICARE

## 2021-01-02 ENCOUNTER — Encounter: Admit: 2021-01-02 | Discharge: 2021-01-02 | Payer: MEDICARE

## 2021-01-02 DIAGNOSIS — Z95810 Presence of automatic (implantable) cardiac defibrillator: Secondary | ICD-10-CM

## 2021-01-08 ENCOUNTER — Encounter: Admit: 2021-01-08 | Discharge: 2021-01-08 | Payer: MEDICARE

## 2021-01-16 ENCOUNTER — Encounter: Admit: 2021-01-16 | Discharge: 2021-01-16 | Payer: MEDICARE

## 2021-01-16 ENCOUNTER — Ambulatory Visit: Admit: 2021-01-16 | Discharge: 2021-01-16 | Payer: MEDICARE

## 2021-01-16 DIAGNOSIS — Z8679 Personal history of other diseases of the circulatory system: Secondary | ICD-10-CM

## 2021-01-16 DIAGNOSIS — I480 Paroxysmal atrial fibrillation: Secondary | ICD-10-CM

## 2021-01-16 DIAGNOSIS — I5042 Chronic combined systolic (congestive) and diastolic (congestive) heart failure: Secondary | ICD-10-CM

## 2021-01-16 DIAGNOSIS — G4730 Sleep apnea, unspecified: Secondary | ICD-10-CM

## 2021-01-16 DIAGNOSIS — Z95810 Presence of automatic (implantable) cardiac defibrillator: Secondary | ICD-10-CM

## 2021-01-16 DIAGNOSIS — T50905A Adverse effect of unspecified drugs, medicaments and biological substances, initial encounter: Secondary | ICD-10-CM

## 2021-01-16 DIAGNOSIS — R32 Unspecified urinary incontinence: Secondary | ICD-10-CM

## 2021-01-16 DIAGNOSIS — E785 Hyperlipidemia, unspecified: Secondary | ICD-10-CM

## 2021-01-16 DIAGNOSIS — I10 Essential (primary) hypertension: Secondary | ICD-10-CM

## 2021-01-16 DIAGNOSIS — N390 Urinary tract infection, site not specified: Secondary | ICD-10-CM

## 2021-01-16 DIAGNOSIS — Z9889 Other specified postprocedural states: Secondary | ICD-10-CM

## 2021-01-16 DIAGNOSIS — Z789 Other specified health status: Secondary | ICD-10-CM

## 2021-01-16 DIAGNOSIS — N400 Enlarged prostate without lower urinary tract symptoms: Secondary | ICD-10-CM

## 2021-01-16 DIAGNOSIS — I219 Acute myocardial infarction, unspecified: Secondary | ICD-10-CM

## 2021-01-16 DIAGNOSIS — E039 Hypothyroidism, unspecified: Secondary | ICD-10-CM

## 2021-01-16 DIAGNOSIS — E162 Hypoglycemia, unspecified: Secondary | ICD-10-CM

## 2021-01-16 DIAGNOSIS — M1990 Unspecified osteoarthritis, unspecified site: Secondary | ICD-10-CM

## 2021-01-16 DIAGNOSIS — I428 Other cardiomyopathies: Secondary | ICD-10-CM

## 2021-01-16 DIAGNOSIS — K635 Polyp of colon: Secondary | ICD-10-CM

## 2021-01-16 DIAGNOSIS — I340 Nonrheumatic mitral (valve) insufficiency: Secondary | ICD-10-CM

## 2021-01-16 DIAGNOSIS — R040 Epistaxis: Secondary | ICD-10-CM

## 2021-01-16 DIAGNOSIS — Z7901 Long term (current) use of anticoagulants: Secondary | ICD-10-CM

## 2021-01-16 DIAGNOSIS — K469 Unspecified abdominal hernia without obstruction or gangrene: Secondary | ICD-10-CM

## 2021-01-16 DIAGNOSIS — K219 Gastro-esophageal reflux disease without esophagitis: Secondary | ICD-10-CM

## 2021-01-16 DIAGNOSIS — Z9229 Personal history of other drug therapy: Secondary | ICD-10-CM

## 2021-01-16 DIAGNOSIS — N401 Enlarged prostate with lower urinary tract symptoms: Secondary | ICD-10-CM

## 2021-01-16 DIAGNOSIS — Z952 Presence of prosthetic heart valve: Secondary | ICD-10-CM

## 2021-01-16 DIAGNOSIS — I4811 Longstanding persistent atrial fibrillation: Secondary | ICD-10-CM

## 2021-01-16 DIAGNOSIS — N529 Male erectile dysfunction, unspecified: Secondary | ICD-10-CM

## 2021-01-16 NOTE — Patient Instructions
Thank you for visiting our office today.   We recommend follow-up with our office in 6 months.   Please call [phone] if you need to re-schedule the office visit Dr. Bufford Buttner ordered.     Please complete the following orders:     - Schedule an In Clinic Device check    -Weigh yourself daily after waking up and using toilet, before you eat or drink anything.   Record the weights and report weight gain of 2 pounds in two days or 5 pounds in five days.   Heart healthy, low salt diet will also help keep blood pressure lower and prevent water weight gain. Do not add any salt to your food, and limit fried food, canned foods and lunch meat, which all typically have added salt.    Take your medications as ordered.   Check your list with what you have on hand at home.   Should you have any additional questions or concerns, please message me through MyChart or call the office.    Cardiovascular Medicine Team   Clinic phone: [phone]

## 2021-02-15 ENCOUNTER — Encounter: Admit: 2021-02-15 | Discharge: 2021-02-15 | Payer: MEDICARE

## 2021-02-15 ENCOUNTER — Ambulatory Visit: Admit: 2021-02-15 | Discharge: 2021-02-15 | Payer: MEDICARE

## 2021-02-15 DIAGNOSIS — Z95810 Presence of automatic (implantable) cardiac defibrillator: Secondary | ICD-10-CM

## 2021-02-16 ENCOUNTER — Encounter: Admit: 2021-02-16 | Discharge: 2021-02-16 | Payer: MEDICARE

## 2021-02-20 ENCOUNTER — Encounter: Admit: 2021-02-20 | Discharge: 2021-02-20 | Payer: MEDICARE

## 2021-02-20 DIAGNOSIS — Z8679 Personal history of other diseases of the circulatory system: Secondary | ICD-10-CM

## 2021-02-20 DIAGNOSIS — N401 Enlarged prostate with lower urinary tract symptoms: Secondary | ICD-10-CM

## 2021-02-20 DIAGNOSIS — N400 Enlarged prostate without lower urinary tract symptoms: Secondary | ICD-10-CM

## 2021-02-20 DIAGNOSIS — U071 COVID-19: Secondary | ICD-10-CM

## 2021-02-20 DIAGNOSIS — R32 Unspecified urinary incontinence: Secondary | ICD-10-CM

## 2021-02-20 DIAGNOSIS — I10 Essential (primary) hypertension: Secondary | ICD-10-CM

## 2021-02-20 DIAGNOSIS — Z9229 Personal history of other drug therapy: Secondary | ICD-10-CM

## 2021-02-20 DIAGNOSIS — I4821 Permanent atrial fibrillation: Secondary | ICD-10-CM

## 2021-02-20 DIAGNOSIS — N529 Male erectile dysfunction, unspecified: Secondary | ICD-10-CM

## 2021-02-20 DIAGNOSIS — E785 Hyperlipidemia, unspecified: Secondary | ICD-10-CM

## 2021-02-20 DIAGNOSIS — Z789 Other specified health status: Secondary | ICD-10-CM

## 2021-02-20 DIAGNOSIS — I480 Paroxysmal atrial fibrillation: Secondary | ICD-10-CM

## 2021-02-20 DIAGNOSIS — K635 Polyp of colon: Secondary | ICD-10-CM

## 2021-02-20 DIAGNOSIS — K219 Gastro-esophageal reflux disease without esophagitis: Secondary | ICD-10-CM

## 2021-02-20 DIAGNOSIS — I428 Other cardiomyopathies: Secondary | ICD-10-CM

## 2021-02-20 DIAGNOSIS — Z952 Presence of prosthetic heart valve: Secondary | ICD-10-CM

## 2021-02-20 DIAGNOSIS — K469 Unspecified abdominal hernia without obstruction or gangrene: Secondary | ICD-10-CM

## 2021-02-20 DIAGNOSIS — T50905A Adverse effect of unspecified drugs, medicaments and biological substances, initial encounter: Secondary | ICD-10-CM

## 2021-02-20 DIAGNOSIS — I219 Acute myocardial infarction, unspecified: Secondary | ICD-10-CM

## 2021-02-20 DIAGNOSIS — E162 Hypoglycemia, unspecified: Secondary | ICD-10-CM

## 2021-02-20 DIAGNOSIS — I340 Nonrheumatic mitral (valve) insufficiency: Secondary | ICD-10-CM

## 2021-02-20 DIAGNOSIS — Z95810 Presence of automatic (implantable) cardiac defibrillator: Secondary | ICD-10-CM

## 2021-02-20 DIAGNOSIS — E039 Hypothyroidism, unspecified: Secondary | ICD-10-CM

## 2021-02-20 DIAGNOSIS — N390 Urinary tract infection, site not specified: Secondary | ICD-10-CM

## 2021-02-20 DIAGNOSIS — M1990 Unspecified osteoarthritis, unspecified site: Secondary | ICD-10-CM

## 2021-02-20 DIAGNOSIS — Z9889 Other specified postprocedural states: Secondary | ICD-10-CM

## 2021-02-20 DIAGNOSIS — G4730 Sleep apnea, unspecified: Secondary | ICD-10-CM

## 2021-02-20 DIAGNOSIS — R040 Epistaxis: Secondary | ICD-10-CM

## 2021-02-20 MED ORDER — SPIRONOLACTONE 25 MG PO TAB
12.5 mg | ORAL_TABLET | Freq: Every day | ORAL | 1 refills | 90.00000 days | Status: AC
Start: 2021-02-20 — End: ?

## 2021-02-20 MED ORDER — TORSEMIDE 10 MG PO TAB
ORAL_TABLET | ORAL | 1 refills | 67.50000 days | Status: AC
Start: 2021-02-20 — End: ?

## 2021-03-28 ENCOUNTER — Encounter: Admit: 2021-03-28 | Discharge: 2021-03-28 | Payer: MEDICARE

## 2021-03-28 NOTE — Telephone Encounter
Patient was scheduled for a Medtronic Carelink on 03/22/21 that has not been received. Patient was instructed to send a manual transmission. Instructed if the transmitter does not appear to be working properly, they need to contact the device company directly. Patient was provided with that contact number. Requested the patient send [place] a MyChart message or contact our device nurses at [phone] to let us know after they have sent their transmission. LVM for pt to send in a transmission. BC     Note: Patient needs to send a manual transmission as we did not receive their scheduled remote interrogation.

## 2021-05-15 ENCOUNTER — Encounter: Admit: 2021-05-15 | Discharge: 2021-05-15 | Payer: MEDICARE

## 2021-06-12 ENCOUNTER — Encounter: Admit: 2021-06-12 | Discharge: 2021-06-12 | Payer: MEDICARE

## 2021-06-12 NOTE — Telephone Encounter
Received notification that  Medtronic Carelink has not been connected since 04/03/21. Patient was instructed to look at his/her transmitter to make sure that it is plugged into power and send a manual transmission to reconnect the transmitter. If he/she has any questions about how to send a transmission or if the transmitter does not appear to be working properly, they need to contact the device company directly. Patient was provided with that contact number. Requested the patient send [place] a MyChart message or contact our device nurses at [phone] to let us know after they have sent their transmission. LVM for pt to reconnect. BC .     Note: Patient needs to send a manual remote interrogation to reestablish communication to his/her remote transmitter.

## 2021-06-29 ENCOUNTER — Encounter: Admit: 2021-06-29 | Discharge: 2021-06-29 | Payer: MEDICARE

## 2021-07-13 ENCOUNTER — Encounter: Admit: 2021-07-13 | Discharge: 2021-07-13 | Payer: MEDICARE

## 2021-07-13 NOTE — Telephone Encounter
I spoke to the pt's wife.  The pt is in [place] with his son temporarily but she is not sure when he will be back home.  She forgot to send his home monitor with him.  I provided her Med[employer]'s number to see if they could deliver a new monitor to [place] otherwise she will mail it to him.  She requested at this time that we continue to do his remote monitoring.  I sent a in-basket to remote coordinators to inform them of the situation.

## 2021-07-24 ENCOUNTER — Encounter: Admit: 2021-07-24 | Discharge: 2021-07-24 | Payer: MEDICARE

## 2021-07-26 ENCOUNTER — Encounter: Admit: 2021-07-26 | Discharge: 2021-07-26 | Payer: MEDICARE

## 2021-07-27 ENCOUNTER — Encounter: Admit: 2021-07-27 | Discharge: 2021-07-27 | Payer: MEDICARE

## 2021-08-14 ENCOUNTER — Encounter: Admit: 2021-08-14 | Discharge: 2021-08-14 | Payer: MEDICARE

## 2021-08-14 DIAGNOSIS — Z95810 Presence of automatic (implantable) cardiac defibrillator: Secondary | ICD-10-CM

## 2021-08-14 NOTE — Progress Notes
Bolder, Morrell Riddle, RN  Patient will call after vacation to schedule.          Previous Messages      ----- Message -----   From: Marland Kitchen, RN   Sent: 08/14/2021 10:45 AM CDT   To: Cvm State Scheduling   Subject: device check and follow up needed           Please contact pt to schedule device check and follow up visit with Dr. Bufford Buttner. Thank you!

## 2021-08-14 NOTE — Progress Notes
Device and follow up orders placed, message to scheduling.       Griffith Citron, RN  P Cvm Nurse Gen Card Team [place]  Pt is due for office follow up in ~02/2022 with annual in office ICD check. Orders for follow up are not in the chart(looks like scheduling finalized his last OV orders as well). Please enter orders for OV(if applicable) and future order for ICD eval.      Follow up with patient and scheduling as needed.      Thank you in advance!!   -Kaylee/Device Team

## 2021-08-18 ENCOUNTER — Encounter: Admit: 2021-08-18 | Discharge: 2021-08-18 | Payer: MEDICARE

## 2021-09-28 ENCOUNTER — Encounter: Admit: 2021-09-28 | Discharge: 2021-09-28 | Payer: MEDICARE

## 2021-10-02 ENCOUNTER — Encounter: Admit: 2021-10-02 | Discharge: 2021-10-02 | Payer: MEDICARE

## 2021-10-02 NOTE — Telephone Encounter
Called pt and confirmed that we were to transfer his remote monitoring over to [facility] office. MDT and MURJ have been updated at this time. Transfer request was accepted. BC

## 2022-01-23 ENCOUNTER — Encounter: Admit: 2022-01-23 | Discharge: 2022-01-23 | Payer: MEDICARE

## 2022-01-23 DIAGNOSIS — Z95810 Presence of automatic (implantable) cardiac defibrillator: Secondary | ICD-10-CM

## 2022-06-05 DEATH — deceased

## 2023-01-17 ENCOUNTER — Encounter: Admit: 2023-01-17 | Discharge: 2023-01-17 | Payer: MEDICARE
